# Patient Record
Sex: Female | Born: 1975 | Race: Black or African American | Hispanic: No | Marital: Married | Smoking: Never smoker
Health system: Southern US, Community
[De-identification: ages and names within clinical notes are randomized; demographics above are authoritative.]

## PROBLEM LIST (undated history)

## (undated) HISTORY — PX: CHOLECYSTECTOMY: SHX55

---

## 1999-02-18 ENCOUNTER — Inpatient Hospital Stay (HOSPITAL_COMMUNITY): Admission: AD | Admit: 1999-02-18 | Discharge: 1999-02-18 | Payer: Self-pay | Admitting: *Deleted

## 2001-11-10 ENCOUNTER — Emergency Department (HOSPITAL_COMMUNITY): Admission: EM | Admit: 2001-11-10 | Discharge: 2001-11-10 | Payer: Self-pay | Admitting: Emergency Medicine

## 2001-11-12 ENCOUNTER — Inpatient Hospital Stay (HOSPITAL_COMMUNITY): Admission: AD | Admit: 2001-11-12 | Discharge: 2001-11-12 | Payer: Self-pay | Admitting: *Deleted

## 2001-11-14 ENCOUNTER — Inpatient Hospital Stay (HOSPITAL_COMMUNITY): Admission: AD | Admit: 2001-11-14 | Discharge: 2001-11-14 | Payer: Self-pay | Admitting: *Deleted

## 2001-11-21 ENCOUNTER — Inpatient Hospital Stay (HOSPITAL_COMMUNITY): Admission: AD | Admit: 2001-11-21 | Discharge: 2001-11-21 | Payer: Self-pay | Admitting: Obstetrics and Gynecology

## 2002-09-22 ENCOUNTER — Inpatient Hospital Stay (HOSPITAL_COMMUNITY): Admission: AD | Admit: 2002-09-22 | Discharge: 2002-09-22 | Payer: Self-pay | Admitting: Obstetrics and Gynecology

## 2003-04-16 ENCOUNTER — Other Ambulatory Visit: Admission: RE | Admit: 2003-04-16 | Discharge: 2003-04-16 | Payer: Self-pay | Admitting: Obstetrics and Gynecology

## 2003-05-31 ENCOUNTER — Encounter: Payer: Self-pay | Admitting: Obstetrics and Gynecology

## 2003-05-31 ENCOUNTER — Ambulatory Visit (HOSPITAL_COMMUNITY): Admission: RE | Admit: 2003-05-31 | Discharge: 2003-05-31 | Payer: Self-pay | Admitting: Obstetrics and Gynecology

## 2003-08-27 ENCOUNTER — Ambulatory Visit (HOSPITAL_COMMUNITY): Admission: RE | Admit: 2003-08-27 | Discharge: 2003-08-27 | Payer: Self-pay | Admitting: Obstetrics and Gynecology

## 2003-10-27 ENCOUNTER — Inpatient Hospital Stay (HOSPITAL_COMMUNITY): Admission: AD | Admit: 2003-10-27 | Discharge: 2003-10-27 | Payer: Self-pay | Admitting: Obstetrics and Gynecology

## 2004-01-25 ENCOUNTER — Inpatient Hospital Stay (HOSPITAL_COMMUNITY): Admission: AD | Admit: 2004-01-25 | Discharge: 2004-01-27 | Payer: Self-pay | Admitting: Obstetrics and Gynecology

## 2004-06-25 ENCOUNTER — Other Ambulatory Visit: Admission: RE | Admit: 2004-06-25 | Discharge: 2004-06-25 | Payer: Self-pay | Admitting: Obstetrics and Gynecology

## 2005-04-20 ENCOUNTER — Inpatient Hospital Stay (HOSPITAL_COMMUNITY): Admission: AD | Admit: 2005-04-20 | Discharge: 2005-04-20 | Payer: Self-pay | Admitting: Obstetrics and Gynecology

## 2005-04-29 ENCOUNTER — Other Ambulatory Visit: Admission: RE | Admit: 2005-04-29 | Discharge: 2005-04-29 | Payer: Self-pay | Admitting: Obstetrics and Gynecology

## 2005-09-30 ENCOUNTER — Inpatient Hospital Stay (HOSPITAL_COMMUNITY): Admission: AD | Admit: 2005-09-30 | Discharge: 2005-09-30 | Payer: Self-pay | Admitting: Obstetrics and Gynecology

## 2006-04-24 ENCOUNTER — Ambulatory Visit (HOSPITAL_COMMUNITY): Admission: RE | Admit: 2006-04-24 | Discharge: 2006-04-25 | Payer: Self-pay

## 2006-09-09 ENCOUNTER — Inpatient Hospital Stay (HOSPITAL_COMMUNITY): Admission: AD | Admit: 2006-09-09 | Discharge: 2006-09-09 | Payer: Self-pay | Admitting: Obstetrics

## 2006-10-29 ENCOUNTER — Ambulatory Visit (HOSPITAL_COMMUNITY): Admission: RE | Admit: 2006-10-29 | Discharge: 2006-10-29 | Payer: Self-pay | Admitting: Obstetrics

## 2006-11-12 ENCOUNTER — Ambulatory Visit (HOSPITAL_COMMUNITY): Admission: RE | Admit: 2006-11-12 | Discharge: 2006-11-12 | Payer: Self-pay | Admitting: Obstetrics

## 2007-03-07 ENCOUNTER — Ambulatory Visit (HOSPITAL_COMMUNITY): Admission: RE | Admit: 2007-03-07 | Discharge: 2007-03-07 | Payer: Self-pay | Admitting: Obstetrics

## 2007-04-18 ENCOUNTER — Inpatient Hospital Stay (HOSPITAL_COMMUNITY): Admission: AD | Admit: 2007-04-18 | Discharge: 2007-04-21 | Payer: Self-pay | Admitting: Obstetrics & Gynecology

## 2008-02-19 ENCOUNTER — Emergency Department (HOSPITAL_COMMUNITY): Admission: EM | Admit: 2008-02-19 | Discharge: 2008-02-19 | Payer: Self-pay | Admitting: Emergency Medicine

## 2008-04-27 ENCOUNTER — Ambulatory Visit (HOSPITAL_COMMUNITY): Admission: RE | Admit: 2008-04-27 | Discharge: 2008-04-27 | Payer: Self-pay | Admitting: Obstetrics

## 2008-05-25 ENCOUNTER — Ambulatory Visit (HOSPITAL_COMMUNITY): Admission: RE | Admit: 2008-05-25 | Discharge: 2008-05-25 | Payer: Self-pay | Admitting: Obstetrics

## 2008-08-04 ENCOUNTER — Emergency Department (HOSPITAL_COMMUNITY): Admission: EM | Admit: 2008-08-04 | Discharge: 2008-08-05 | Payer: Self-pay | Admitting: Emergency Medicine

## 2008-10-06 ENCOUNTER — Inpatient Hospital Stay (HOSPITAL_COMMUNITY): Admission: AD | Admit: 2008-10-06 | Discharge: 2008-10-10 | Payer: Self-pay | Admitting: Obstetrics

## 2008-10-07 ENCOUNTER — Encounter: Payer: Self-pay | Admitting: Obstetrics & Gynecology

## 2010-10-26 ENCOUNTER — Encounter: Payer: Self-pay | Admitting: Obstetrics

## 2011-01-09 ENCOUNTER — Inpatient Hospital Stay (INDEPENDENT_AMBULATORY_CARE_PROVIDER_SITE_OTHER)
Admission: RE | Admit: 2011-01-09 | Discharge: 2011-01-09 | Disposition: A | Discharge status: Home / Self Care | Payer: Self-pay | Source: Ambulatory Visit | Attending: Emergency Medicine | Admitting: Emergency Medicine

## 2011-01-09 DIAGNOSIS — R112 Nausea with vomiting, unspecified: Secondary | ICD-10-CM

## 2011-01-09 LAB — POCT PREGNANCY, URINE: Preg Test, Ur: NEGATIVE

## 2011-01-09 LAB — POCT URINALYSIS DIP (DEVICE)
Bilirubin Urine: NEGATIVE
Glucose, UA: NEGATIVE mg/dL
Ketones, ur: NEGATIVE mg/dL
Nitrite: NEGATIVE
Protein, ur: NEGATIVE mg/dL
Specific Gravity, Urine: 1.015 (ref 1.005–1.030)
Urobilinogen, UA: 1 mg/dL (ref 0.0–1.0)
pH: 7 (ref 5.0–8.0)

## 2011-01-19 LAB — CBC
HCT: 26.9 % — ABNORMAL LOW (ref 36.0–46.0)
HCT: 30.9 % — ABNORMAL LOW (ref 36.0–46.0)
HCT: 34.7 % — ABNORMAL LOW (ref 36.0–46.0)
Hemoglobin: 10.4 g/dL — ABNORMAL LOW (ref 12.0–15.0)
Hemoglobin: 11.6 g/dL — ABNORMAL LOW (ref 12.0–15.0)
Hemoglobin: 9.1 g/dL — ABNORMAL LOW (ref 12.0–15.0)
MCHC: 33.4 g/dL (ref 30.0–36.0)
MCHC: 33.8 g/dL (ref 30.0–36.0)
MCHC: 33.9 g/dL (ref 30.0–36.0)
MCV: 83.7 fL (ref 78.0–100.0)
MCV: 84.6 fL (ref 78.0–100.0)
MCV: 84.8 fL (ref 78.0–100.0)
Platelets: 145 10*3/uL — ABNORMAL LOW (ref 150–400)
Platelets: 155 10*3/uL (ref 150–400)
Platelets: 171 10*3/uL (ref 150–400)
RBC: 3.17 MIL/uL — ABNORMAL LOW (ref 3.87–5.11)
RBC: 3.69 MIL/uL — ABNORMAL LOW (ref 3.87–5.11)
RBC: 4.1 MIL/uL (ref 3.87–5.11)
RDW: 14.5 % (ref 11.5–15.5)
RDW: 14.6 % (ref 11.5–15.5)
RDW: 15.1 % (ref 11.5–15.5)
WBC: 10.3 10*3/uL (ref 4.0–10.5)
WBC: 9.1 10*3/uL (ref 4.0–10.5)
WBC: 9.3 10*3/uL (ref 4.0–10.5)

## 2011-01-19 LAB — RPR: RPR Ser Ql: NONREACTIVE

## 2011-02-17 NOTE — Op Note (Signed)
NAMELINNET, BOTTARI              ACCOUNT NO.:  [id number]   MEDICAL RECORD NO.:  [id number]          PATIENT TYPE:  INP   LOCATION:  9109                          FACILITY:  [facility]   PHYSICIAN:  Roseanna Rainbow, M.D.DATE OF BIRTH:  05/13/1976   DATE OF PROCEDURE:  10/07/2008  DATE OF DISCHARGE:                               OPERATIVE REPORT   PREOPERATIVE DIAGNOSES:  1. Intrauterine pregnancy at term.  2. Breech presentation.  3. Oligohydramnios.   POSTOPERATIVE DIAGNOSES:  1. Intrauterine pregnancy at term.  2. Breech presentation.  3. Oligohydramnios.   PROCEDURE:  Primary low-uterine flap elliptical cesarean delivery.   SURGEONS:  Roseanna Rainbow, MD and Kathreen Cosier, MD   ANESTHESIA:  Spinal.   PATHOLOGY:  Placenta.   ESTIMATED BLOOD LOSS:  500 mL.   COMPLICATIONS:  None.   DESCRIPTION OF PROCEDURE:  The patient was taken to the operating room  with an IV running.  She was placed in the dorsal supine position with a  leftward tilt and prepped and draped in the usual sterile fashion.  After a time-out had been completed, a transverse skin incision was then  made with the scalpel and carried down to the fascia.  The fascia was  nicked in the midline.  The fascial incision was then extended  bilaterally.  The superior aspect of the fascial incision was tented up  and the underlying rectus muscles dissected off.  The inferior aspect of  the fascial incision was manipulated in a similar fashion.  The rectus  muscles were separated in the midline.  The parietal peritoneum was  tented up and entered sharply.  This incision was then extended  superiorly and inferiorly with good visualization of the bladder.  The  bladder blade was then placed.  The vesicouterine peritoneum was tented  up and entered sharply.  This incision was then extended bilaterally and  the bladder flap created bluntly.  At this point, a loop of sigmoid  colon was packed away with a  moistened laparotomy sponge.  The lower  uterine segment was then incised in a transverse fashion with the  scalpel.  This incision was then extended bluntly.  A complete breech  extraction was then performed.  Marcells Smelly Vite maneuver was  performed to facilitate delivery of the head, which was delivered  atraumatically.  The cord was clamped and cut.  The infant was handed  off to the awaiting neonatologist.  The placenta was then removed.  The  intrauterine cavity was evacuated of any remaining amniotic fluid,  clots, and debris with moist laparotomy sponge.  The uterine incision  was then reapproximated in a running interlocking fashion using a suture  of 0 Monocryl.  A second imbricating layer of the same suture was  placed.  Adequate hemostasis was noted.  The paracolic gutters were  copiously irrigated.  The parietal peritoneum was reapproximated in a  running fashion using a 2-0 Vicryl suture.  The fascia was closed using  2 running sutures of 0 Vicryl meeting in the midline.  The skin was  closed in a subcuticular fashion  using 3-0 Vicryl.  Dermabond was then  applied.  At the closure of the procedure, the instrument and pack  counts were said to be correct x2.  A 2 g of cephazolin had been given  at the start of the procedure.  The patient was taken to the PACU,  awake, and in stable condition.      Roseanna Rainbow, M.D.  Electronically Signed     LAJ/MEDQ  D:  10/07/2008  T:  10/07/2008  Job:  [id number]

## 2011-02-17 NOTE — H&P (Signed)
NAMEARLYS, Helen Murphy              ACCOUNT NO.:  [id number]   MEDICAL RECORD NO.:  [id number]          PATIENT TYPE:  INP   LOCATION:  9173                          FACILITY:  [facility]   PHYSICIAN:  Roseanna Rainbow, M.D.DATE OF BIRTH:  [birth year]   DATE OF ADMISSION:  04/18/2007  DATE OF DISCHARGE:                              HISTORY & PHYSICAL   CHIEF COMPLAINT:  The patient is a 35 year old para I with an estimated  date of confinement of July 14 with an intrauterine pregnancy at 40  weeks, complaining of contractions.   HISTORY OF PRESENT ILLNESS:  Please see the above.   ALLERGIES:  No known drug allergies.   MEDICATIONS:  Prenatal vitamins.   OBSTETRICAL RISK FACTORS:  She is Rh-negative, non-sensitized, history  of recurrent pregnancy wastage.   PRENATAL SCREENING:  Chlamydia probe negative.  GC probe negative.  Urine culture and sensitivity:  No growth.  Hepatitis B surface antigen  negative.  HIV nonreactive.  Hematocrit 37.4, hemoglobin 12.5, platelets  259,000.  Blood type is O negative, antibody screen negative.  RPR  nonreactive.  Rubella immune.  Sickle cell negative.  Twenty-four-hour  urine in December 2007:  Protein 63 mg per day of protein.  GBS  negative.   PAST GYNECOLOGICAL HISTORY:  Noncontributory.   PAST MEDICAL HISTORY:  No significant history of medical diseases.   PAST SURGICAL HISTORY:  No previous surgery.   SOCIAL HISTORY:  She is employed.  She performs office work.  She is  married, living with her spouse, does not give any significant history  of alcohol usage, has no significant smoking history, denies illicit  drug use.   FAMILY HISTORY:  No major illnesses known.   OBSTETRICAL HISTORY:  She has had 7 previous spontaneous abortions.  In  April 2005, she was delivered of a 7-pound 6-ounce female, full-term  vaginal delivery, no complications.   PHYSICAL EXAMINATION:  VITAL SIGNS:  Pulse 69, respirations 18, blood  pressure 113/86.   Fetal heart tracing:  Initial baseline 170s with a  decrease in the baseline to 150s to 160s, no decelerations.  PELVIC:  On sterile vaginal exam per the RN, the cervix is fingertip,  30% effaced with a vertex at a -3 station.  A BPP was 8/8; however, the  amniotic fluid index was 6.9.   ASSESSMENT:  1. Intrauterine pregnancy at term.  2. Oligohydramnios.  3. Unfavorable Bishop score.  4. Fetal heart tracing consistent with fetal well-being as well as the      biophysical profile.   PLAN:  Admission, 2-stage induction of labor.      Roseanna Rainbow, M.D.  Electronically Signed     LAJ/MEDQ  D:  04/19/2007  T:  04/19/2007  Job:  [id number]

## 2011-02-20 NOTE — H&P (Signed)
NAME:  Helen Murphy, Helen Murphy                ACCOUNT NO.:  [id number]   MEDICAL RECORD NO.:  [id number]                   PATIENT TYPE:  INP   LOCATION:  9170                                 FACILITY:  [facility]   PHYSICIAN:  Osborn Coho, M.D.                DATE OF BIRTH:  August 28, 1976   DATE OF ADMISSION:  01/25/2004  DATE OF DISCHARGE:                                HISTORY & PHYSICAL   This is a 35 year old gravida 4, para 0-0-3-0, at 40-1/7 weeks, who presents  with complaints of increased uterine contractions over the past few hours.  She denies leaking or bleeding and reports positive fetal movement.  Pregnancy has been followed by Dr. Pennie Rushing and remarkable for:   1. Rh negative.  2. SAB x3.  3. Language barrier.  4. Family history of diabetes.  5. Group B strep negative.    PAST OBSTETRICAL HISTORY:  Remarkable for first trimester spontaneous  abortions in 2000, 2002, and 2003.   PAST MEDICAL HISTORY:  Unremarkable.   FAMILY HISTORY:  Remarkable for mother and sister with hypertension, mother  and a brother with noninsulin-dependent diabetes, and a sister with thyroid  dysfunction of unknown type.   GENETIC HISTORY:  Unremarkable.   SOCIAL HISTORY:  The patient is married to Ahmed, who is involved and  supportive.  She is of the Muslim faith.  She denies any alcohol, tobacco,  or drug use.   PRENATAL LABORATORY DATA:  Hemoglobin 11.6, platelets 241.  Blood type O  negative, antibody screen negative.  RPR negative.  Rubella immune.  Hepatitis negative.  HIV declined.  Pap test normal.  Gonorrhea negative,  Chlamydia negative.  Cystic fibrosis declined.  Hemoglobin electrophoresis  within normal limits.   HISTORY OF CURRENT PREGNANCY:  The patient entered care at 10 weeks.  She  was on progesterone suppositories in the first part of her pregnancy to  prevent miscarriage.  She did well.  She developed some anemia and was  placed on iron supplementation.  She had an  elevated Glucola and underwent a  three-hour GTT, which was normal.  Her group B strep was negative.  The rest  of the pregnancy was normal.   OBJECTIVE:  VITAL SIGNS:  Stable, afebrile.  HEENT:  Within normal limits.  NECK:  Thyroid normal, not enlarged.  CHEST:  Clear to auscultation.  CARDIAC:  Regular rate and rhythm.  ABDOMEN:  Gravid at 40 cm, vertex to Leopold's.  EFM shows reactive fetal  heart rate with uterine contractions every two minutes.  PELVIC:  Cervix is 5 cm, 85%, -1, vertex presentation.  EXTREMITIES:  Within normal limits.   The patient is requesting an epidural.   ASSESSMENT:  1. Intrauterine pregnancy at term.  2. Active labor.   PLAN:  1. Admit to birthing suites per Dr. Su Hilt.  2. Routine M.D. orders.  3. Epidural.     Marie L. Williams, C.N.M.  Osborn Coho, M.D.    MLW/MEDQ  D:  01/25/2004  T:  01/25/2004  Job:  [id number]

## 2011-02-20 NOTE — Discharge Summary (Signed)
Helen Murphy, Helen Murphy              ACCOUNT NO.:  [id number]   MEDICAL RECORD NO.:  [id number]           PATIENT TYPE:   LOCATION:                                 FACILITY:   PHYSICIAN:  Charles A. Clearance Coots, M.D.DATE OF BIRTH:  [birth year]   DATE OF ADMISSION:  10/07/2008  DATE OF DISCHARGE:  10/10/2008                               DISCHARGE SUMMARY   ADMITTING DIAGNOSES:  Intrauterine pregnancy at term, uterine  contractions, breech presentation, oligohydramnios.   DISCHARGE DIAGNOSES:  Intrauterine pregnancy at term, uterine  contractions, breech presentation, oligohydramnios status post primary  low transverse cesarean section on October 07, 2008.   Viable female fetus was delivered at 8:15.  Apgars of 9 at one minute and  9 at five minutes, weight of 3840 g, length of 53.34 cm.  Mother and  infant discharged home in good condition.   REASON FOR ADMISSION:  A 35 year old [place] female presented  with uterine contractions.  On exam, presentation was noted to be  breech.  Ultrasound was done which confirmed the breech presentation  with amniotic fluid index of 2.  The decision was made to proceed with  cesarean section delivery for breech presentation at term.   PAST MEDICAL HISTORY:  Surgery:  None.  Illnesses:  None.   MEDICATIONS:  Prenatal vitamins.   ALLERGIES:  No known drug allergies.   SOCIAL HISTORY:  Married.  Negative tobacco, alcohol, or recreational  drug use.   FAMILY HISTORY:  No major illnesses known.   PHYSICAL EXAMINATION:  GENERAL:  Well-nourished, well-developed female  in no acute distress.  LUNGS:  Clear to auscultation bilaterally.  HEART:  Regular rate and rhythm.  ABDOMEN:  Gravid, nontender.  Cervix 2 cm dilated, 50% effaced, and the  presentation was breech.   ADMITTING LABORATORY DATA:  Hemoglobin 11.6, hematocrit 34.7, white  blood cell count 9100, platelets 145,000.  RPR was nonreactive.   HOSPITAL COURSE:  The patient underwent  primary low transverse cesarean  section on October 07, 2008.  There are no intraoperative complications.  Postoperative course was uncomplicated.  The patient was discharged home  on postop day #3 in good condition.   DISCHARGE LABORATORY DATA:  Hemoglobin 9.1, hematocrit 26.9, white blood  cell count 10,300, platelets 155,000.   DISCHARGE DISPOSITION:  Medications, continue prenatal vitamins.  Ibuprofen and Percocet were prescribed for pain.  Routine written  instructions were given for discharge after cesarean section.  The  patient should call office for followup appointment in 2 weeks.      Charles A. Clearance Coots, M.D.  Electronically Signed     CAH/MEDQ  D:  11/13/2008  T:  11/13/2008  Job:  [id number]

## 2011-07-07 LAB — POCT CARDIAC MARKERS
CKMB, poc: <1 — ABNORMAL LOW
Myoglobin, poc: 22.4

## 2011-07-07 LAB — POCT I-STAT, CHEM 8
BUN: <3 — ABNORMAL LOW
Calcium, Ion: 1.16
Chloride: 108
Creatinine, Ser: 0.7
Glucose, Bld: 100 — ABNORMAL HIGH
HCT: 34 — ABNORMAL LOW
Hemoglobin: 11.6 — ABNORMAL LOW
Potassium: 3.4 — ABNORMAL LOW
Sodium: 138
TCO2: 21

## 2011-07-07 LAB — GLUCOSE, CAPILLARY: Glucose-Capillary: 105 — ABNORMAL HIGH

## 2011-07-07 LAB — CBC
HCT: 34.6 — ABNORMAL LOW
Hemoglobin: 11.7 — ABNORMAL LOW
MCHC: 33.9
MCV: 85.1
Platelets: 151
RBC: 4.07
RDW: 13
WBC: 6.1

## 2011-07-20 LAB — CBC
MCHC: 33.3
MCV: 84.8
Platelets: 188
RBC: 3.77 — ABNORMAL LOW
RDW: 14.8 — ABNORMAL HIGH

## 2011-07-20 LAB — RH IMMUNE GLOB WKUP(>/=20WKS)(NOT WOMEN'S HOSP): Fetal Screen: NEGATIVE

## 2011-07-21 LAB — CBC
HCT: 42.4
Hemoglobin: 14
MCHC: 33.1
MCV: 84.5
RBC: 5.01
RDW: 14

## 2012-07-07 ENCOUNTER — Emergency Department (HOSPITAL_COMMUNITY): Payer: Medicaid Other

## 2012-07-07 ENCOUNTER — Emergency Department (HOSPITAL_COMMUNITY)
Admission: EM | Admit: 2012-07-07 | Discharge: 2012-07-08 | Disposition: A | Discharge status: Home / Self Care | Payer: Medicaid Other | Attending: Emergency Medicine | Admitting: Emergency Medicine

## 2012-07-07 ENCOUNTER — Encounter (HOSPITAL_COMMUNITY): Payer: Self-pay | Admitting: Emergency Medicine

## 2012-07-07 DIAGNOSIS — N200 Calculus of kidney: Secondary | ICD-10-CM | POA: Insufficient documentation

## 2012-07-07 DIAGNOSIS — D72829 Elevated white blood cell count, unspecified: Secondary | ICD-10-CM | POA: Insufficient documentation

## 2012-07-07 DIAGNOSIS — R509 Fever, unspecified: Secondary | ICD-10-CM | POA: Insufficient documentation

## 2012-07-07 DIAGNOSIS — R059 Cough, unspecified: Secondary | ICD-10-CM | POA: Insufficient documentation

## 2012-07-07 DIAGNOSIS — J3489 Other specified disorders of nose and nasal sinuses: Secondary | ICD-10-CM | POA: Insufficient documentation

## 2012-07-07 DIAGNOSIS — K8020 Calculus of gallbladder without cholecystitis without obstruction: Secondary | ICD-10-CM

## 2012-07-07 DIAGNOSIS — R7401 Elevation of levels of liver transaminase levels: Secondary | ICD-10-CM | POA: Insufficient documentation

## 2012-07-07 DIAGNOSIS — M549 Dorsalgia, unspecified: Secondary | ICD-10-CM | POA: Insufficient documentation

## 2012-07-07 DIAGNOSIS — R05 Cough: Secondary | ICD-10-CM | POA: Insufficient documentation

## 2012-07-07 DIAGNOSIS — R109 Unspecified abdominal pain: Secondary | ICD-10-CM | POA: Insufficient documentation

## 2012-07-07 DIAGNOSIS — R748 Abnormal levels of other serum enzymes: Secondary | ICD-10-CM

## 2012-07-07 DIAGNOSIS — N898 Other specified noninflammatory disorders of vagina: Secondary | ICD-10-CM | POA: Insufficient documentation

## 2012-07-07 DIAGNOSIS — R070 Pain in throat: Secondary | ICD-10-CM | POA: Insufficient documentation

## 2012-07-07 DIAGNOSIS — R0982 Postnasal drip: Secondary | ICD-10-CM | POA: Insufficient documentation

## 2012-07-07 DIAGNOSIS — R079 Chest pain, unspecified: Secondary | ICD-10-CM | POA: Insufficient documentation

## 2012-07-07 DIAGNOSIS — R7402 Elevation of levels of lactic acid dehydrogenase (LDH): Secondary | ICD-10-CM | POA: Insufficient documentation

## 2012-07-07 LAB — COMPREHENSIVE METABOLIC PANEL
ALT: 38 U/L — ABNORMAL HIGH (ref 0–35)
Alkaline Phosphatase: 126 U/L — ABNORMAL HIGH (ref 39–117)
BUN: 6 mg/dL (ref 6–23)
CO2: 25 mEq/L (ref 19–32)
GFR calc Af Amer: >90 mL/min (ref >90)
GFR calc non Af Amer: >90 mL/min (ref >90)
Glucose, Bld: 109 mg/dL — ABNORMAL HIGH (ref 70–99)
Potassium: 3.6 mEq/L (ref 3.5–5.1)
Sodium: 133 mEq/L — ABNORMAL LOW (ref 135–145)

## 2012-07-07 LAB — CBC WITH DIFFERENTIAL/PLATELET
Eosinophils Relative: 1 % (ref 0–5)
Hemoglobin: 12.3 g/dL (ref 12.0–15.0)
Lymphocytes Relative: 21 % (ref 12–46)
Lymphs Abs: 3.4 10*3/uL (ref 0.7–4.0)
MCV: 82.6 fL (ref 78.0–100.0)
Monocytes Relative: 8 % (ref 3–12)
Neutrophils Relative %: 70 % (ref 43–77)
Platelets: 203 10*3/uL (ref 150–400)
RBC: 4.32 MIL/uL (ref 3.87–5.11)
WBC: 16.3 10*3/uL — ABNORMAL HIGH (ref 4.0–10.5)

## 2012-07-07 LAB — WET PREP, GENITAL

## 2012-07-07 LAB — URINALYSIS, ROUTINE W REFLEX MICROSCOPIC
Glucose, UA: NEGATIVE mg/dL
Ketones, ur: NEGATIVE mg/dL
Nitrite: NEGATIVE
Specific Gravity, Urine: 1.024 (ref 1.005–1.030)
pH: 6.5 (ref 5.0–8.0)

## 2012-07-07 LAB — RAPID STREP SCREEN (MED CTR MEBANE ONLY): Streptococcus, Group A Screen (Direct): NEGATIVE

## 2012-07-07 MED ORDER — MORPHINE SULFATE 2 MG/ML IJ SOLN
2.0000 mg | Freq: Once | INTRAMUSCULAR | Status: AC
  Administered 2012-07-07: 2 mg via INTRAVENOUS
  Filled 2012-07-07: qty 1

## 2012-07-07 MED ORDER — SODIUM CHLORIDE 0.9 % IV BOLUS (SEPSIS)
500.0000 mL | Freq: Once | INTRAVENOUS | Status: AC
  Administered 2012-07-07: 500 mL via INTRAVENOUS

## 2012-07-07 MED ORDER — IOHEXOL 300 MG/ML  SOLN
100.0000 mL | Freq: Once | INTRAMUSCULAR | Status: AC | PRN
  Administered 2012-07-07: 100 mL via INTRAVENOUS

## 2012-07-07 NOTE — ED Notes (Signed)
Pt to ultrasound with Porfirio Mylar NT as chaperone.

## 2012-07-07 NOTE — ED Provider Notes (Signed)
History     CSN: [id number]  Arrival date & time 07/07/12  1723   First MD Initiated Contact with Patient 07/07/12 1814      Chief Complaint  Patient presents with  . Flank Pain  . Sore Throat  . Fever  . Chest Pain    Intermittent    (Consider location/radiation/quality/duration/timing/severity/associated sxs/prior treatment) The history is provided by the patient, the spouse and medical records.    Helen Murphy is a 36 y.o. female presents to the emergency department complaining of right flank pain, fever, sore throat and chest pain.  The onset of the symptoms was  gradual starting 3 days ago.  The patient has associated congestion, cough.  The symptoms have been  intermittent, gradually worsened.  nothing makes the symptoms worse and nothing makes symptoms better.  The patient denies neck pain, chest pain, abdominal pain, nausea, vomiting, diarrhea, dysuria, hematuria, frequency, urgency.  Pt states she has 3 children who are at home with URIs.  She states her CP is sharp, intermittent, initiated by and worsened with coughing.  She states her R flank pain is also intermittent, sharp, rated at a 10/10 and radiates into her back.  It began yesterday and lasted approx 2 hours after which it resolved.  Pt states it returned this morning and then resolved prior to arrival.    History reviewed. No pertinent past medical history.  Past Surgical History  Procedure Date  . Cesarean section     No family history on file.  History  Substance Use Topics  . Smoking status: Never Smoker   . Smokeless tobacco: Not on file  . Alcohol Use: No    OB History    Grav Para Term Preterm Abortions TAB SAB Ect Mult Living                  Review of Systems  Constitutional: Positive for fever, chills and activity change. Negative for diaphoresis, appetite change, fatigue and unexpected weight change.  HENT: Positive for congestion, sore throat, rhinorrhea, postnasal drip and  sinus pressure. Negative for nosebleeds, mouth sores, neck pain and neck stiffness.   Eyes: Negative for visual disturbance.  Respiratory: Positive for cough and chest tightness. Negative for shortness of breath and wheezing.   Cardiovascular: Positive for chest pain.  Gastrointestinal: Negative for nausea, vomiting, abdominal pain, diarrhea and constipation.  Genitourinary: Positive for flank pain. Negative for dysuria, urgency, frequency and hematuria.  Musculoskeletal: Positive for back pain. Negative for myalgias, joint swelling and gait problem.  Skin: Negative for rash.  Neurological: Negative for syncope, light-headedness and headaches.  Hematological: Does not bruise/bleed easily.  Psychiatric/Behavioral: Negative for disturbed wake/sleep cycle. The patient is not nervous/anxious.   All other systems reviewed and are negative.    Allergies  Review of patient's allergies indicates no known allergies.  Home Medications  No current outpatient prescriptions on file.  BP 131/81  Pulse 85  Temp 98.6 F (37 C) (Oral)  Resp 17  SpO2 100%  LMP 06/21/2012  Physical Exam  Nursing note and vitals reviewed. Constitutional: She appears well-developed and well-nourished. No distress.  HENT:  Head: Normocephalic and atraumatic.  Right Ear: Tympanic membrane, external ear and ear canal normal.  Left Ear: Tympanic membrane, external ear and ear canal normal.  Nose: Mucosal edema and rhinorrhea present. Right sinus exhibits no maxillary sinus tenderness and no frontal sinus tenderness. Left sinus exhibits no maxillary sinus tenderness and no frontal sinus tenderness.  Mouth/Throat: Uvula is  midline, oropharynx is clear and moist and mucous membranes are normal. Mucous membranes are not pale and not cyanotic. No uvula swelling. No oropharyngeal exudate, posterior oropharyngeal edema, posterior oropharyngeal erythema or tonsillar abscesses.  Eyes: Conjunctivae normal are normal. Pupils are  equal, round, and reactive to light. No scleral icterus.  Neck: Normal range of motion. Neck supple.  Cardiovascular: Normal rate, regular rhythm and intact distal pulses.  Exam reveals no gallop and no friction rub.   No murmur heard. Pulmonary/Chest: Effort normal and breath sounds normal. No respiratory distress. She has no wheezes. She has no rales. She exhibits no tenderness.  Abdominal: Soft. Normal appearance and bowel sounds are normal. She exhibits no mass. There is no hepatosplenomegaly. There is tenderness in the right lower quadrant. There is CVA tenderness. There is no rigidity, no rebound, no guarding, no tenderness at McBurney's point and negative Murphy's sign. Hernia confirmed negative in the right inguinal area and confirmed negative in the left inguinal area.  Genitourinary: Pelvic exam was performed with patient supine. No labial fusion. There is no rash, tenderness, lesion or injury on the right labia. There is no rash, tenderness, lesion or injury on the left labia. Uterus is not deviated, not enlarged, not fixed and not tender. Cervix exhibits discharge. Cervix exhibits no motion tenderness. Right adnexum displays no mass, no tenderness and no fullness. Left adnexum displays no mass and no tenderness. No erythema, tenderness or bleeding around the vagina. No foreign body around the vagina. No signs of injury around the vagina. Vaginal discharge found.       Strawberry cervix with gray frothy discharge  Musculoskeletal: Normal range of motion. She exhibits no edema.  Lymphadenopathy:    She has no cervical adenopathy.       Right: No inguinal adenopathy present.       Left: No inguinal adenopathy present.  Neurological: She is alert. She exhibits normal muscle tone. Coordination normal.       Speech is clear and goal oriented Moves extremities without ataxia  Skin: Skin is warm and dry. No rash noted. She is not diaphoretic.  Psychiatric: She has a normal mood and affect.     ED Course  Procedures (including critical care time)  Labs Reviewed  URINALYSIS, ROUTINE W REFLEX MICROSCOPIC - Abnormal; Notable for the following:    Hgb urine dipstick SMALL (*)     Bilirubin Urine SMALL (*)     Leukocytes, UA SMALL (*)     All other components within normal limits  CBC WITH DIFFERENTIAL - Abnormal; Notable for the following:    WBC 16.3 (*)     HCT 35.7 (*)     Neutro Abs 11.4 (*)     Monocytes Absolute 1.3 (*)     All other components within normal limits  COMPREHENSIVE METABOLIC PANEL - Abnormal; Notable for the following:    Sodium 133 (*)     Glucose, Bld 109 (*)     Total Protein 8.4 (*)     AST 52 (*)     ALT 38 (*)     Alkaline Phosphatase 126 (*)     All other components within normal limits  URINE MICROSCOPIC-ADD ON - Abnormal; Notable for the following:    Squamous Epithelial / LPF FEW (*)     All other components within normal limits  RAPID STREP SCREEN  POCT PREGNANCY, URINE  LIPASE, BLOOD  WET PREP, GENITAL  GC/CHLAMYDIA PROBE AMP, GENITAL  GC/CHLAMYDIA PROBE AMP, GENITAL  Dg Chest 2 View  07/07/2012  *RADIOLOGY REPORT*  Clinical Data: 36 year old female left side chest pain cough and congestion.  CHEST - 2 VIEW  Comparison: Chest CTA 08/04/2008.  Findings: Slightly shallow lung volumes.  Cardiac size and mediastinal contours are within normal limits.  Visualized tracheal air column is within normal limits.  The lungs are clear.  No pneumothorax or effusion.  EKG leads and wires overlie the chest. No acute osseous abnormality identified.  IMPRESSION: Negative, no acute cardiopulmonary abnormality.   Original Report Authenticated By: Harley Hallmark, M.D.    Results for orders placed during the hospital encounter of 07/07/12  RAPID STREP SCREEN      Component Value Range   Streptococcus, Group A Screen (Direct) NEGATIVE  NEGATIVE  URINALYSIS, ROUTINE W REFLEX MICROSCOPIC      Component Value Range   Color, Urine YELLOW  YELLOW    APPearance CLEAR  CLEAR   Specific Gravity, Urine 1.024  1.005 - 1.030   pH 6.5  5.0 - 8.0   Glucose, UA NEGATIVE  NEGATIVE mg/dL   Hgb urine dipstick SMALL (*) NEGATIVE   Bilirubin Urine SMALL (*) NEGATIVE   Ketones, ur NEGATIVE  NEGATIVE mg/dL   Protein, ur NEGATIVE  NEGATIVE mg/dL   Urobilinogen, UA 1.0  0.0 - 1.0 mg/dL   Nitrite NEGATIVE  NEGATIVE   Leukocytes, UA SMALL (*) NEGATIVE  POCT PREGNANCY, URINE      Component Value Range   Preg Test, Ur NEGATIVE  NEGATIVE  CBC WITH DIFFERENTIAL      Component Value Range   WBC 16.3 (*) 4.0 - 10.5 K/uL   RBC 4.32  3.87 - 5.11 MIL/uL   Hemoglobin 12.3  12.0 - 15.0 g/dL   HCT 84.1 (*) 32.4 - 40.1 %   MCV 82.6  78.0 - 100.0 fL   MCH 28.5  26.0 - 34.0 pg   MCHC 34.5  30.0 - 36.0 g/dL   RDW 02.7  25.3 - 66.4 %   Platelets 203  150 - 400 K/uL   Neutrophils Relative 70  43 - 77 %   Neutro Abs 11.4 (*) 1.7 - 7.7 K/uL   Lymphocytes Relative 21  12 - 46 %   Lymphs Abs 3.4  0.7 - 4.0 K/uL   Monocytes Relative 8  3 - 12 %   Monocytes Absolute 1.3 (*) 0.1 - 1.0 K/uL   Eosinophils Relative 1  0 - 5 %   Eosinophils Absolute 0.1  0.0 - 0.7 K/uL   Basophils Relative 0  0 - 1 %   Basophils Absolute 0.0  0.0 - 0.1 K/uL  COMPREHENSIVE METABOLIC PANEL      Component Value Range   Sodium 133 (*) 135 - 145 mEq/L   Potassium 3.6  3.5 - 5.1 mEq/L   Chloride 100  96 - 112 mEq/L   CO2 25  19 - 32 mEq/L   Glucose, Bld 109 (*) 70 - 99 mg/dL   BUN 6  6 - 23 mg/dL   Creatinine, Ser 4.03  0.50 - 1.10 mg/dL   Calcium 8.9  8.4 - 47.4 mg/dL   Total Protein 8.4 (*) 6.0 - 8.3 g/dL   Albumin 3.5  3.5 - 5.2 g/dL   AST 52 (*) 0 - 37 U/L   ALT 38 (*) 0 - 35 U/L   Alkaline Phosphatase 126 (*) 39 - 117 U/L   Total Bilirubin 0.3  0.3 - 1.2 mg/dL   GFR calc non  Af Amer >90  >90 mL/min   GFR calc Af Amer >90  >90 mL/min  LIPASE, BLOOD      Component Value Range   Lipase 35  11 - 59 U/L  URINE MICROSCOPIC-ADD ON      Component Value Range   Squamous  Epithelial / LPF FEW (*) RARE   WBC, UA 3-6  <3 WBC/hpf   RBC / HPF 3-6  <3 RBC/hpf   Dg Chest 2 View  07/07/2012  *RADIOLOGY REPORT*  Clinical Data: 36 year old female left side chest pain cough and congestion.  CHEST - 2 VIEW  Comparison: Chest CTA 08/04/2008.  Findings: Slightly shallow lung volumes.  Cardiac size and mediastinal contours are within normal limits.  Visualized tracheal air column is within normal limits.  The lungs are clear.  No pneumothorax or effusion.  EKG leads and wires overlie the chest. No acute osseous abnormality identified.  IMPRESSION: Negative, no acute cardiopulmonary abnormality.   Original Report Authenticated By: Harley Hallmark, M.D.    1. Abdominal pain       MDM  Helen Murphy resents with cough congestion fever chest pain flank pain.  Cough congestion fever chest pain likely associated with a URI.  Flank pain concerning for kidney stone; however patient has pain to palpation of the right lower quadrant pain needing to rule out appendicitis.  Patient with leukocytosis of 16.3 and left shift, urinalysis with small leukocytes small amount hemoglobin and a few white blood cells, lipase within normal limits, CMP with elevated liver enzymes and alkaline phosphatase.  Strep screen negative, chest x-ray without evidence of infiltrate.  Patient remains alert and oriented, vital signs stable, nonseptic nontoxic appearing, in no acute distress.  Pelvic exam concerning for possible trichomoniasis; awaiting the wet prep.  Patient is finishing her oral contrast and we have ordered an abdominal/pelvic CT.  Dr Ranae Palms will assume care.  Dr. Loren Racer was consulted, evaluated this patient with me and agrees with the plan.           Dahlia Client Brooke Steinhilber, PA-C 07/07/12 2044

## 2012-07-07 NOTE — ED Provider Notes (Signed)
Pt report received from Dr. Ranae Palms.  Pt presents with URI sxs.  She however had elevated liver enzyme, leukocytosis and has reproducible Murphy sign with palpation.  Plan to consult surgery once [place] result is back.    12:41 AM Abd US shows cholelithiasis without evidence of cholecystitis.  Pt's pain is controlled.  She has no fever.  I have consulted with surgeon Dr. Abbey Chatters who recommend outpt f/u for further care.  I encourage pt to stay on a clear liquid diet and to call [facility] for f/u.  Strict return precaution including fever, vomit, or worsening pain to return to ER.  Pt voice understanding and agrees with plan.    BP 125/81  Pulse 94  Temp 98.6 F (37 C) (Oral)  Resp 16  SpO2 100%  LMP 06/21/2012   Results for orders placed during the hospital encounter of 07/07/12  RAPID STREP SCREEN      Component Value Range   Streptococcus, Group A Screen (Direct) NEGATIVE  NEGATIVE  URINALYSIS, ROUTINE W REFLEX MICROSCOPIC      Component Value Range   Color, Urine YELLOW  YELLOW   APPearance CLEAR  CLEAR   Specific Gravity, Urine 1.024  1.005 - 1.030   pH 6.5  5.0 - 8.0   Glucose, UA NEGATIVE  NEGATIVE mg/dL   Hgb urine dipstick SMALL (*) NEGATIVE   Bilirubin Urine SMALL (*) NEGATIVE   Ketones, ur NEGATIVE  NEGATIVE mg/dL   Protein, ur NEGATIVE  NEGATIVE mg/dL   Urobilinogen, UA 1.0  0.0 - 1.0 mg/dL   Nitrite NEGATIVE  NEGATIVE   Leukocytes, UA SMALL (*) NEGATIVE  POCT PREGNANCY, URINE      Component Value Range   Preg Test, Ur NEGATIVE  NEGATIVE  CBC WITH DIFFERENTIAL      Component Value Range   WBC 16.3 (*) 4.0 - 10.5 K/uL   RBC 4.32  3.87 - 5.11 MIL/uL   Hemoglobin 12.3  12.0 - 15.0 g/dL   HCT 16.1 (*) 09.6 - 04.5 %   MCV 82.6  78.0 - 100.0 fL   MCH 28.5  26.0 - 34.0 pg   MCHC 34.5  30.0 - 36.0 g/dL   RDW 40.9  81.1 - 91.4 %   Platelets 203  150 - 400 K/uL   Neutrophils Relative 70  43 - 77 %   Neutro Abs 11.4 (*) 1.7 - 7.7 K/uL   Lymphocytes Relative 21  12 - 46 %   Lymphs Abs 3.4  0.7 - 4.0 K/uL   Monocytes Relative 8  3 - 12 %   Monocytes Absolute 1.3 (*) 0.1 - 1.0 K/uL   Eosinophils Relative 1  0 - 5 %   Eosinophils Absolute 0.1  0.0 - 0.7 K/uL   Basophils Relative 0  0 - 1 %   Basophils Absolute 0.0  0.0 - 0.1 K/uL  COMPREHENSIVE METABOLIC PANEL      Component Value Range   Sodium 133 (*) 135 - 145 mEq/L   Potassium 3.6  3.5 - 5.1 mEq/L   Chloride 100  96 - 112 mEq/L   CO2 25  19 - 32 mEq/L   Glucose, Bld 109 (*) 70 - 99 mg/dL   BUN 6  6 - 23 mg/dL   Creatinine, Ser 7.82  0.50 - 1.10 mg/dL   Calcium 8.9  8.4 - 95.6 mg/dL   Total Protein 8.4 (*) 6.0 - 8.3 g/dL   Albumin 3.5  3.5 - 5.2 g/dL   AST  52 (*) 0 - 37 U/L   ALT 38 (*) 0 - 35 U/L   Alkaline Phosphatase 126 (*) 39 - 117 U/L   Total Bilirubin 0.3  0.3 - 1.2 mg/dL   GFR calc non Af Amer >90  >90 mL/min   GFR calc Af Amer >90  >90 mL/min  LIPASE, BLOOD      Component Value Range   Lipase 35  11 - 59 U/L  URINE MICROSCOPIC-ADD ON      Component Value Range   Squamous Epithelial / LPF FEW (*) RARE   WBC, UA 3-6  <3 WBC/hpf   RBC / HPF 3-6  <3 RBC/hpf  WET PREP, GENITAL      Component Value Range   Yeast Wet Prep HPF POC NONE SEEN  NONE SEEN   Trich, Wet Prep NONE SEEN  NONE SEEN   Clue Cells Wet Prep HPF POC FEW (*) NONE SEEN   WBC, Wet Prep HPF POC MANY (*) NONE SEEN   Dg Chest 2 View  07/07/2012  *RADIOLOGY REPORT*  Clinical Data: 36 year old female left side chest pain cough and congestion.  CHEST - 2 VIEW  Comparison: Chest CTA 08/04/2008.  Findings: Slightly shallow lung volumes.  Cardiac size and mediastinal contours are within normal limits.  Visualized tracheal air column is within normal limits.  The lungs are clear.  No pneumothorax or effusion.  EKG leads and wires overlie the chest. No acute osseous abnormality identified.  IMPRESSION: Negative, no acute cardiopulmonary abnormality.   Original Report Authenticated By: Harley Hallmark, M.D.    US Abdomen  Complete  07/08/2012  *RADIOLOGY REPORT*  Clinical Data: Abdominal pain  ABDOMEN ULTRASOUND  Technique:  Complete abdominal ultrasound examination was performed including evaluation of the liver, gallbladder, bile ducts, pancreas, kidneys, spleen, IVC, and abdominal aorta.  Comparison: CT scan from earlier the same visit  Findings:  Gallbladder:  1.9 cm gallstone without evidence for gallbladder wall thickening or pericholecystic fluid.  The sonographer reports a negative sonographic Murphy's sign.  Common Bile Duct:  Nondilated at 4 mm diameter.  Liver:  Normal.  No focal parenchymal abnormality.  No biliary dilation.  IVC:  Normal.  Pancreas:  Normal.  Spleen:  Normal.  Right kidney:  11.0 cm in long axis.  Normal.  Left kidney:  11.2 cm in long axis.  Normal.  Abdominal Aorta:  No aneurysm.  IMPRESSION: Cholelithiasis without gallbladder wall thickening or pericholecystic fluid.  No biliary dilatation.  Otherwise normal exam.   Original Report Authenticated By: ERIC A. MANSELL, M.D.    US Transvaginal Non-ob  07/08/2012  *RADIOLOGY REPORT*  Clinical Data: Right lower quadrant pain  TRANSABDOMINAL AND TRANSVAGINAL ULTRASOUND OF PELVIS Technique:  Both transabdominal and transvaginal ultrasound examinations of the pelvis were performed. Transabdominal technique was performed for global imaging of the pelvis including uterus, ovaries, adnexal regions, and pelvic cul-de-sac.  It was necessary to proceed with endovaginal exam following the transabdominal exam to visualize the endometrium.  Comparison:  CT scan from earlier the same visit  Findings:  Uterus: 8.6 x 5.0 x 6.2 cm.  Myometrium is homogeneous.  Uterus is retroflexed.  Endometrium: 12 mm in double layer thickness.  No fluid in endometrial canal.  Right ovary:  4.1 x 2.9 x 2.6 cm.  Sonographically normal.  Left ovary: 3.0 x 3.6 x 2.6 cm.  Sonographically normal.  Other findings: Tiny fluid adjacent to the right ovary.  IMPRESSION: Trace amount of fluid in  the right adnexal  space.  This finding can be physiologic in a premenopausal female.  Otherwise normal exam.   Original Report Authenticated By: ERIC A. MANSELL, M.D.    US Pelvis Complete  07/08/2012  *RADIOLOGY REPORT*  Clinical Data: Right lower quadrant pain  TRANSABDOMINAL AND TRANSVAGINAL ULTRASOUND OF PELVIS Technique:  Both transabdominal and transvaginal ultrasound examinations of the pelvis were performed. Transabdominal technique was performed for global imaging of the pelvis including uterus, ovaries, adnexal regions, and pelvic cul-de-sac.  It was necessary to proceed with endovaginal exam following the transabdominal exam to visualize the endometrium.  Comparison:  CT scan from earlier the same visit  Findings:  Uterus: 8.6 x 5.0 x 6.2 cm.  Myometrium is homogeneous.  Uterus is retroflexed.  Endometrium: 12 mm in double layer thickness.  No fluid in endometrial canal.  Right ovary:  4.1 x 2.9 x 2.6 cm.  Sonographically normal.  Left ovary: 3.0 x 3.6 x 2.6 cm.  Sonographically normal.  Other findings: Tiny fluid adjacent to the right ovary.  IMPRESSION: Trace amount of fluid in the right adnexal space.  This finding can be physiologic in a premenopausal female.  Otherwise normal exam.   Original Report Authenticated By: ERIC A. MANSELL, M.D.    Ct Abdomen Pelvis W Contrast  07/07/2012  *RADIOLOGY REPORT*  Clinical Data: Right lower quadrant pain and flank pain.  CT ABDOMEN AND PELVIS WITH CONTRAST  Technique:  Multidetector CT imaging of the abdomen and pelvis was performed following the standard protocol during bolus administration of intravenous contrast.  Contrast: OMNIPAQUE IOHEXOL 300 MG/ML  SOLN  Comparison: CT abdomen pelvis 04/24/2006  Findings: The lung bases are clear.  Heart size is normal.  The liver, gallbladder, spleen, adrenal glands, and pancreas are within normal limits.  Common bile duct is normal in caliber. There is a 4 mm low density lesion in the cortex of the lower  pole of the right kidney.  This is statistically likely a cyst, and appears stable compared to prior study.  The left kidney is normal.  There is no hydronephrosis of either kidney. No visible urinary tract calculus. Both ureters are normal in caliber. There is no perinephric or periureteric stranding.  There is some patient motion which slightly limiting its detail of the images through the mid abdomen.  The bowel loops are normal in caliber and there is no evidence of bowel wall thickening.  A definite/discrete appendix is not identified.  No acute inflammatory changes are seen in the pericecal region.  Urinary bladder is normal.  The uterus is retroverted, and otherwise unremarkable.  Ovaries are within normal limits for a premenopausal patient.  There is no lymphadenopathy, free air, or ascites. Abdominal aorta normal in caliber.  Bones are unremarkable.  IMPRESSION: No acute findings in the abdomen or pelvis.  Negative for mass or lymphadenopathy.   Original Report Authenticated By: Britta Mccreedy, M.D.       Fayrene Helper, PA-C 07/08/12 0111

## 2012-07-07 NOTE — ED Notes (Addendum)
Pt presenting to ed with c/o abdominal pain x 3 days pt denies nausea, vomiting and diarrhea. Pt states she has had fever and sore throat. Pt states her kids have been sick pt's family member interpreting at bedside. Pt states pain is more right side flank pain that radiates into her back pt denies dysuria and hematuria

## 2012-07-07 NOTE — ED Provider Notes (Addendum)
Medical screening examination/treatment/procedure(s) were conducted as a shared visit with non-physician practitioner(s) and myself.  I personally evaluated the patient during the encounter  Loren Racer, MD 07/07/12 2335  Pt with 3 days of URI symptoms similar to several of her family members.  She has also, ongoing episodic R sided abd pain that wraps around to back. She has elevated WBC and LFT's. Though no definite evidence of acute cholecystitis on [place], she does have gallstone. Fayrene Helper, PA will contact surgery when [place] read by radiologist. Dispo per their recommendations.   Loren Racer, MD 07/08/12 0003

## 2012-07-08 LAB — GC/CHLAMYDIA PROBE AMP, GENITAL
Chlamydia, DNA Probe: NEGATIVE
GC Probe Amp, Genital: NEGATIVE

## 2012-07-08 MED ORDER — OXYCODONE-ACETAMINOPHEN 5-325 MG PO TABS: 1.0000 | ORAL_TABLET | Freq: Four times a day (QID) | ORAL | Status: DC | PRN

## 2012-07-08 MED ORDER — ONDANSETRON HCL 4 MG PO TABS: 4.0000 mg | ORAL_TABLET | Freq: Four times a day (QID) | ORAL | Status: DC

## 2012-07-10 NOTE — ED Provider Notes (Signed)
Medical screening examination/treatment/procedure(s) were performed by non-physician practitioner and as supervising physician I was immediately available for consultation/collaboration.   Loren Racer, MD 07/10/12 [phone]

## 2012-07-11 ENCOUNTER — Telehealth (INDEPENDENT_AMBULATORY_CARE_PROVIDER_SITE_OTHER): Payer: Self-pay | Admitting: General Surgery

## 2012-07-11 NOTE — Telephone Encounter (Signed)
Patient husband called in asking if his wife could be seen sooner because her gallbladder is giving her pain again. I asked to speak to her directly. When she was given the phone, she only got to tell me that the gallbladder had given her pain again. When I asked if she had anything to eat the husband took over the conversation. Neither one of them could be clearly understood. When I asked again about what she ate today he said [employer], when I asked to confirm he said she had not eaten all day. I advised she was not to eat anything fried, fatty or oily. Advised the appointment with Dr. Derrell Lolling is the earliest available right now. He said o.k and ended call.

## 2012-07-22 ENCOUNTER — Encounter (INDEPENDENT_AMBULATORY_CARE_PROVIDER_SITE_OTHER): Payer: Self-pay | Admitting: General Surgery

## 2012-07-22 ENCOUNTER — Ambulatory Visit (INDEPENDENT_AMBULATORY_CARE_PROVIDER_SITE_OTHER): Payer: Medicaid Other | Admitting: General Surgery

## 2012-07-22 VITALS — BP 123/84 | HR 87 | Temp 99.0°F | Resp 18 | Ht 65.5 in | Wt 181.0 lb

## 2012-07-22 DIAGNOSIS — K8020 Calculus of gallbladder without cholecystitis without obstruction: Secondary | ICD-10-CM

## 2012-07-22 NOTE — Progress Notes (Signed)
Patient ID: Helen Murphy, female   DOB: 18-Feb-1976, 36 y.o.   MRN: [id number]  Chief Complaint  Patient presents with  . Abdominal Pain    gallstones    HPI Helen Murphy is a 36 y.o. female presents after having gone to be deeper abdominal epigastric right upper quadrant pain. He states that this pain is gone for approximately 3 years on and off. She states the pain in the right upper quadrant is most noticeable after spicy or fatty meals. She does have some nausea at times however no emesis in this last episode. HPI  History reviewed. No pertinent past medical history.  Past Surgical History  Procedure Date  . Cesarean section     History reviewed. No pertinent family history.  Social History History  Substance Use Topics  . Smoking status: Never Smoker   . Smokeless tobacco: Not on file  . Alcohol Use: No    No Known Allergies  Current Outpatient Prescriptions  Medication Sig Dispense Refill  . ondansetron (ZOFRAN) 4 MG tablet Take 1 tablet (4 mg total) by mouth every 6 (six) hours.  12 tablet  0  . oxyCODONE-acetaminophen (PERCOCET/ROXICET) 5-325 MG per tablet Take 1-2 tablets by mouth every 6 (six) hours as needed for pain.  20 tablet  0    Review of Systems Review of Systems  Constitutional: Negative.   HENT: Negative.   Eyes: Negative.   Respiratory: Negative.   Cardiovascular: Negative.   Gastrointestinal: Positive for abdominal pain.    Blood pressure 123/84, pulse 87, temperature 99 F (37.2 C), temperature source Temporal, resp. rate 18, height 5' 5.5" (1.664 m), weight 181 lb (82.101 kg), last menstrual period 06/21/2012.  Physical Exam Physical Exam  Constitutional: She is oriented to person, place, and time. She appears well-developed and well-nourished.  HENT:  Head: Normocephalic and atraumatic.  Eyes: Conjunctivae normal and EOM are normal. Pupils are equal, round, and reactive to light.  Neck: Normal range of motion. Neck  supple.  Cardiovascular: Normal rate, regular rhythm and normal heart sounds.   Pulmonary/Chest: Effort normal and breath sounds normal.  Abdominal: Soft. There is tenderness (RUQ).  Musculoskeletal: Normal range of motion.  Neurological: She is alert and oriented to person, place, and time.    Data Reviewed [place] with 1.7 cm GS , LFTS WNL  Assessment    35 year old female with symptomatic choledocholithiasis    Plan    1. Will proceed to the operative prolapsed out cholecystectomy with intraoperative cholangiogram. 2.  All risks and benefits were discussed with the patient, to generally include infection, bleeding, damage to surrounding structures, and recurrence. Alternatives were offered and described.  All questions were answered and the patient voiced understanding of the procedure and wishes to proceed at this point.        Marigene Ehlers., Dora Clauss 07/22/2012, 1:53 PM

## 2012-10-10 ENCOUNTER — Other Ambulatory Visit (INDEPENDENT_AMBULATORY_CARE_PROVIDER_SITE_OTHER): Payer: Self-pay | Admitting: General Surgery

## 2012-10-10 DIAGNOSIS — K8010 Calculus of gallbladder with chronic cholecystitis without obstruction: Secondary | ICD-10-CM

## 2012-10-10 DIAGNOSIS — K824 Cholesterolosis of gallbladder: Secondary | ICD-10-CM

## 2012-10-26 ENCOUNTER — Ambulatory Visit (INDEPENDENT_AMBULATORY_CARE_PROVIDER_SITE_OTHER): Payer: Medicaid Other | Admitting: General Surgery

## 2012-10-26 ENCOUNTER — Encounter (INDEPENDENT_AMBULATORY_CARE_PROVIDER_SITE_OTHER): Payer: Self-pay | Admitting: General Surgery

## 2012-10-26 VITALS — BP 136/72 | HR 68 | Resp 16 | Ht 65.5 in | Wt 178.8 lb

## 2012-10-26 DIAGNOSIS — Z9889 Other specified postprocedural states: Secondary | ICD-10-CM

## 2012-10-26 DIAGNOSIS — Z9049 Acquired absence of other specified parts of digestive tract: Secondary | ICD-10-CM

## 2012-10-26 NOTE — Progress Notes (Signed)
Patient ID: Helen Murphy, female   DOB: [birth year], 37 y.o.   MRN: [id number] The patient's 37 year old female status post laparoscopic cholecystectomy. Patient has been doing well postoperatively. Patient's appetite is returning. Patient complains of no diarrhea at this time.  On exam: Was clean dry and intact  Assessment and plan: 37 year old female status post laparoscopic cholecystectomy. We discussed weight restrictions for another 4 weeks. Patient followup when necessary.

## 2012-10-28 ENCOUNTER — Telehealth (INDEPENDENT_AMBULATORY_CARE_PROVIDER_SITE_OTHER): Payer: Self-pay | Admitting: General Surgery

## 2012-10-28 NOTE — Telephone Encounter (Signed)
Call for pain med refill.  Per standing order protocol:  Hydrocodone 5/325 mg, # 30,  1-2 po Q4-6H prn pain, no refill called to [facility]:  [phone].

## 2013-09-18 IMAGING — CR DG CHEST 2V
2 series · 2 of 2 positions shown · non-contrast
Comparison: Chest CTA 08/04/2008.

CLINICAL DATA: 36-year-old female left side chest pain cough and
congestion.

CHEST - 2 VIEW

[w chest pa]
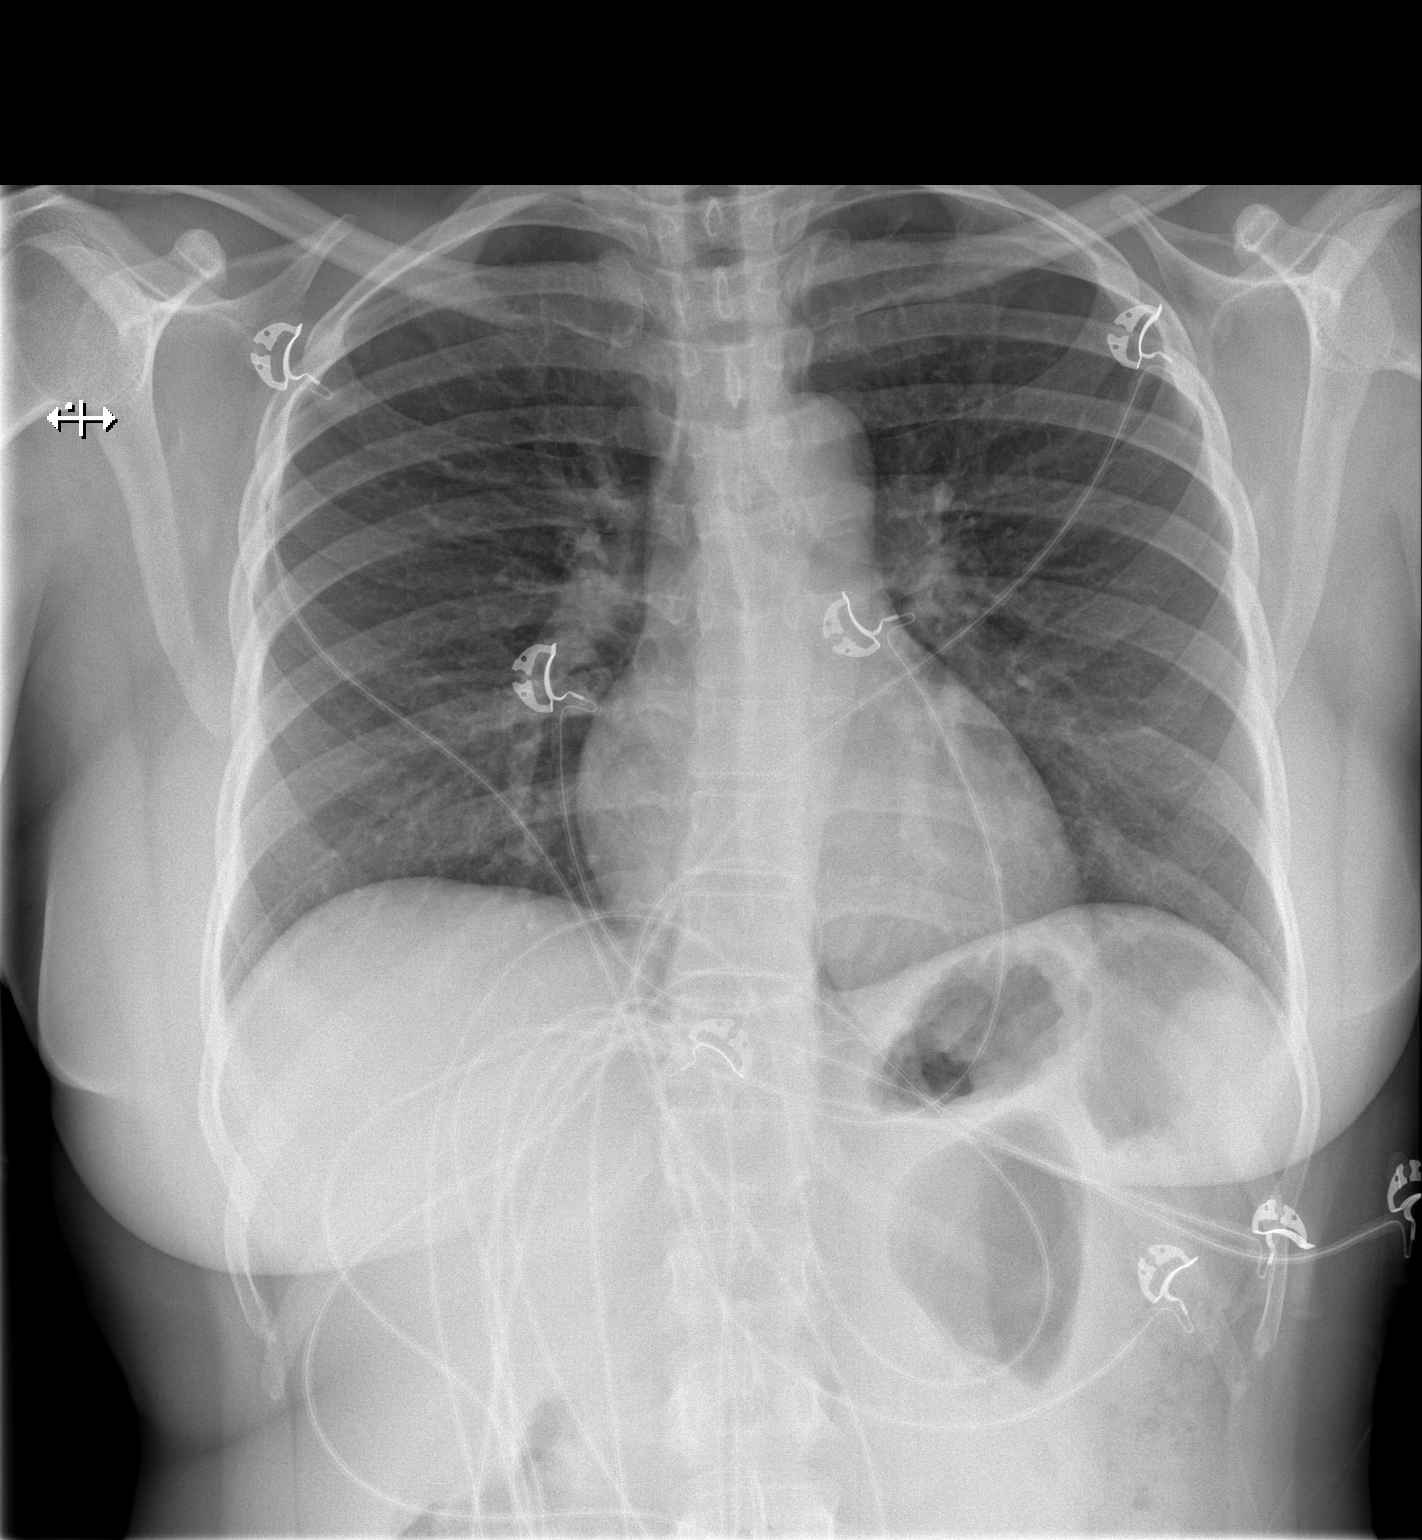

[w chest lat]
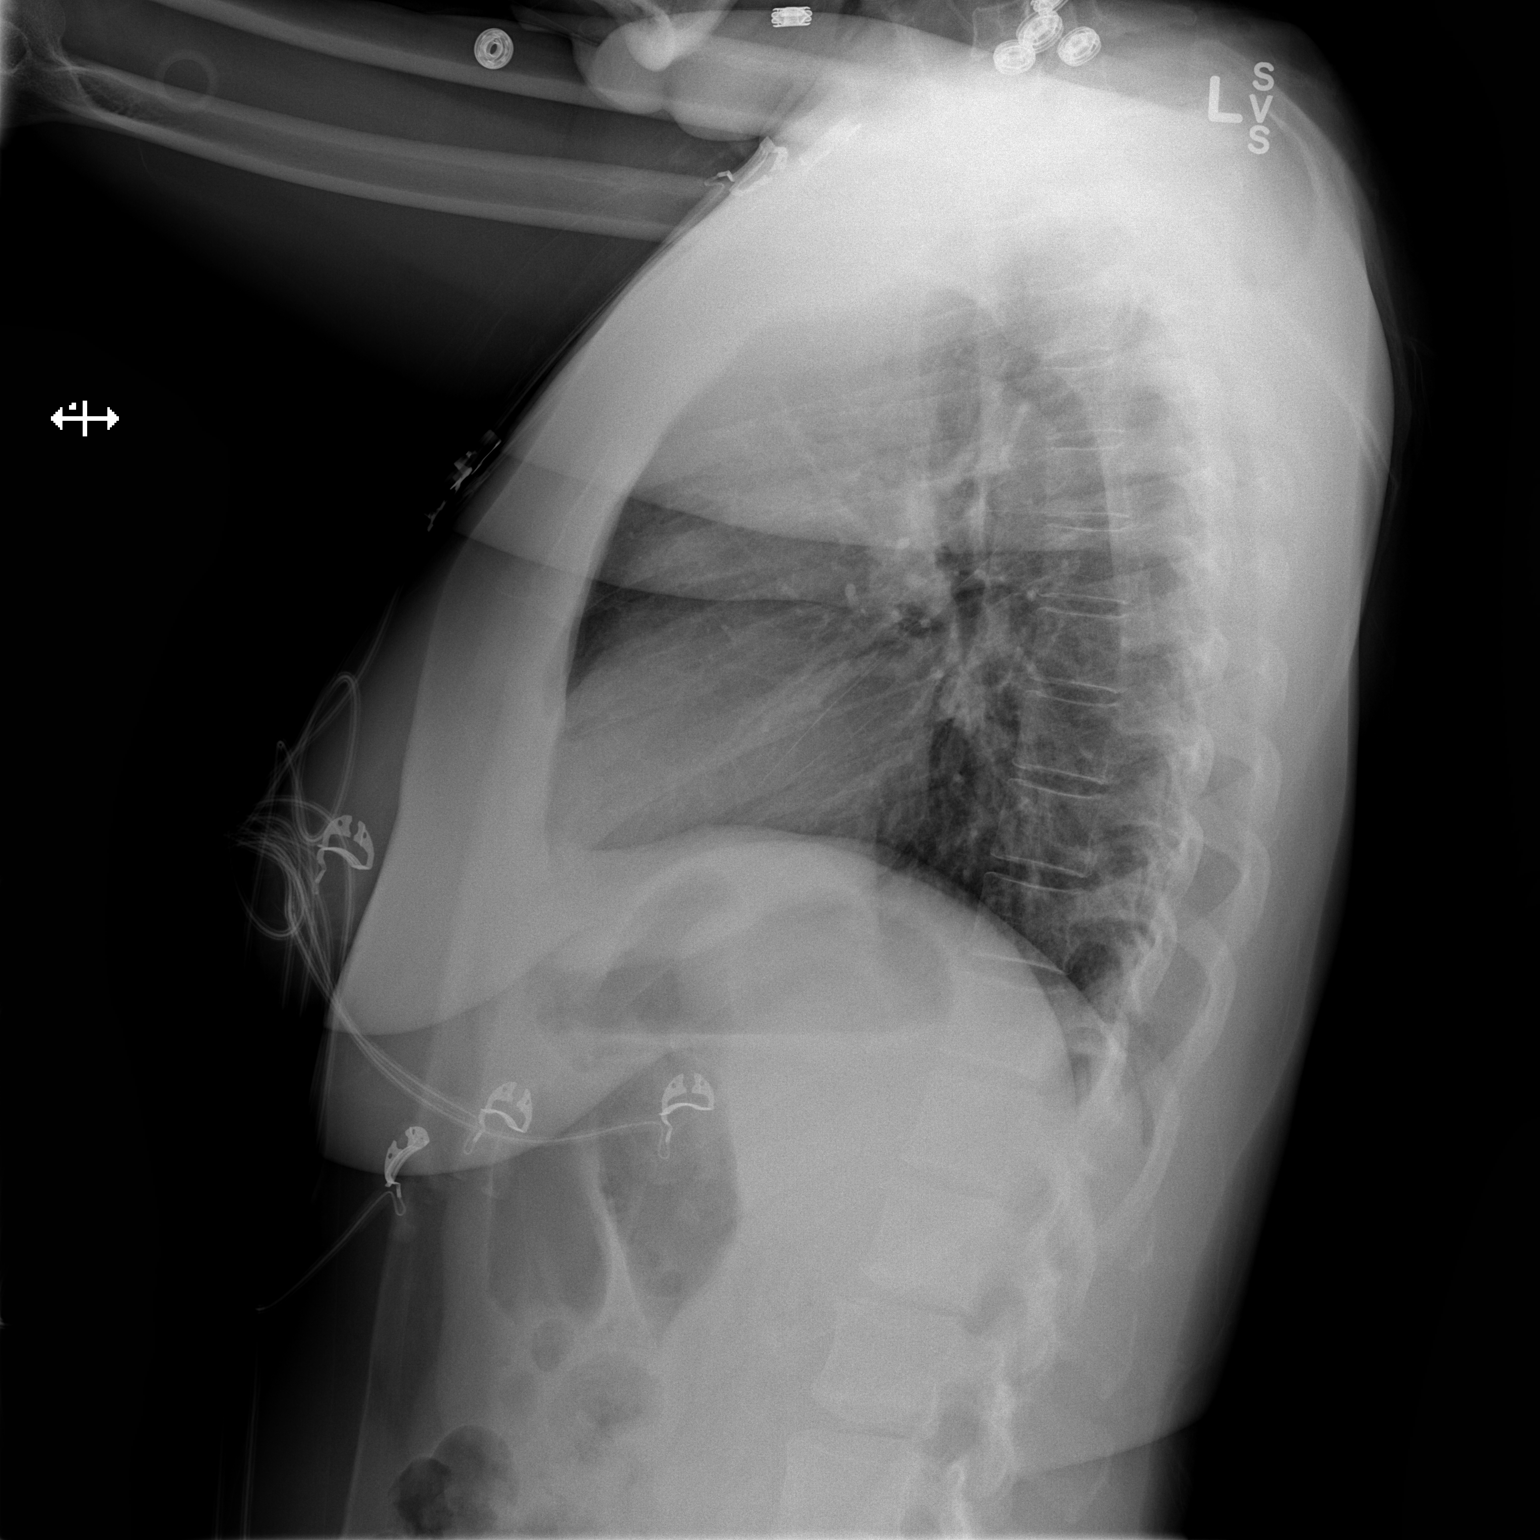

[2 of 2 positions shown; findings below may reference images not displayed]

FINDINGS: Slightly shallow lung volumes.  Cardiac size and
mediastinal contours are within normal limits.  Visualized tracheal
air column is within normal limits.  The lungs are clear.  No
pneumothorax or effusion.  EKG leads and wires overlie the chest.
No acute osseous abnormality identified.
IMPRESSION: Negative, no acute cardiopulmonary abnormality.

## 2015-05-01 ENCOUNTER — Ambulatory Visit (INDEPENDENT_AMBULATORY_CARE_PROVIDER_SITE_OTHER): Payer: Self-pay | Admitting: Family Medicine

## 2015-05-01 VITALS — BP 110/68 | HR 66 | Temp 98.4°F | Resp 18 | Ht 65.75 in | Wt 176.4 lb

## 2015-05-01 DIAGNOSIS — M545 Low back pain, unspecified: Secondary | ICD-10-CM

## 2015-05-01 DIAGNOSIS — R10A0 Flank pain, unspecified side: Secondary | ICD-10-CM

## 2015-05-01 DIAGNOSIS — R945 Abnormal results of liver function studies: Secondary | ICD-10-CM

## 2015-05-01 DIAGNOSIS — R1031 Right lower quadrant pain: Secondary | ICD-10-CM

## 2015-05-01 DIAGNOSIS — R1011 Right upper quadrant pain: Secondary | ICD-10-CM

## 2015-05-01 DIAGNOSIS — N3090 Cystitis, unspecified without hematuria: Secondary | ICD-10-CM

## 2015-05-01 DIAGNOSIS — R109 Unspecified abdominal pain: Secondary | ICD-10-CM

## 2015-05-01 DIAGNOSIS — R7989 Other specified abnormal findings of blood chemistry: Secondary | ICD-10-CM

## 2015-05-01 LAB — POCT UA - MICROSCOPIC ONLY
Casts, Ur, LPF, POC: NEGATIVE
Crystals, Ur, HPF, POC: NEGATIVE
Mucus, UA: NEGATIVE
Yeast, UA: NEGATIVE

## 2015-05-01 LAB — POCT CBC
GRANULOCYTE PERCENT: 40.4 % (ref 37–80)
HEMATOCRIT: 31.8 % — AB (ref 37.7–47.9)
Hemoglobin: 10.4 g/dL — AB (ref 12.2–16.2)
Lymph, poc: 4.1 — AB (ref 0.6–3.4)
MCH, POC: 27.1 pg (ref 27–31.2)
MCHC: 32.7 g/dL (ref 31.8–35.4)
MCV: 83 fL (ref 80–97)
MID (CBC): 0.4 (ref 0–0.9)
MPV: 8.8 fL (ref 0–99.8)
PLATELET COUNT, POC: 219 10*3/uL (ref 142–424)
POC GRANULOCYTE: 3.1 (ref 2–6.9)
POC LYMPH PERCENT: 54.2 %L — AB (ref 10–50)
POC MID %: 5.4 %M (ref 0–12)
RBC: 3.82 M/uL — AB (ref 4.04–5.48)
RDW, POC: 18.5 %
WBC: 7.6 10*3/uL (ref 4.6–10.2)

## 2015-05-01 LAB — POCT URINALYSIS DIPSTICK
Bilirubin, UA: NEGATIVE
Blood, UA: NEGATIVE
Glucose, UA: NEGATIVE
KETONES UA: NEGATIVE
NITRITE UA: NEGATIVE
Protein, UA: NEGATIVE
Spec Grav, UA: 1.02
UROBILINOGEN UA: 0.2
pH, UA: 5.5

## 2015-05-01 LAB — POCT URINE PREGNANCY: Preg Test, Ur: NEGATIVE

## 2015-05-01 MED ORDER — CIPROFLOXACIN HCL 500 MG PO TABS
500.0000 mg | ORAL_TABLET | Freq: Two times a day (BID) | ORAL | Status: DC
Start: 2015-05-01 — End: 2016-07-31

## 2015-05-01 MED ORDER — CYCLOBENZAPRINE HCL 5 MG PO TABS: ORAL_TABLET | ORAL | Status: DC

## 2015-05-01 NOTE — Progress Notes (Addendum)
Subjective:  This chart was scribed for Helen Staggers, MD by Helen Murphy, ED Scribe. This patient was seen in room 10 and the patient's care was started at 4:41 PM.   Patient ID: Helen Murphy, female    DOB: Jul 18, 1976, 39 y.o.   MRN: [id number]  HPI   Chief Complaint  Patient presents with  . Abdominal Pain    Started Sunday, on right side.    HPI Comments: Helen Murphy is a 39 y.o. female who presents to the [facility] complaining of right abdominal pain that began 4 days ago. Pt states pain starts at right abdomen and radiates to right back and right leg. She denies injury.  She has hx of similar pain which usually goes away but current  pain has persisted longer than normal. She denies treating pain. Pt was seen [facility] [place] [facility] at 1:45 today and was thought to have right lower back pain without sciatica. She has hx of gall stones and had cholecystectomy in 2013 but no hx of kidney stones. She has hx of elevated LFT's in November 2013 prior to removal of gallbladder, has not been rechecked.  She denies bladder and bowel incontinence, numbness in groin, fever, hematuria, dysuria. Pt works an Theatre stage manager at [employer] and reports change in work about 2 months.   There are no active problems to display for this patient.  History reviewed. No pertinent past medical history. Past Surgical History  Procedure Laterality Date  . Cesarean section    . Cholecystectomy     No Known Allergies Prior to Admission medications   Not on File   History   Social History  . Marital Status: Married    Spouse Name: N/A  . Number of Children: N/A  . Years of Education: N/A   Occupational History  . Not on file.   Social History Main Topics  . Smoking status: Never Smoker   . Smokeless tobacco: Not on file  . Alcohol Use: No  . Drug Use: No  . Sexual Activity: Not on file   Other Topics Concern  . Not on file   Social History Narrative    Review of Systems  Constitutional: Negative for fever and chills.  Gastrointestinal: Positive for abdominal pain. Negative for nausea and vomiting.  Genitourinary: Negative for dysuria, hematuria, enuresis and difficulty urinating.  Musculoskeletal: Positive for myalgias and back pain.  Skin: Negative for color change and wound.  Neurological: Negative for weakness and numbness.    Objective:   Physical Exam  Constitutional: She is oriented to person, place, and time. She appears well-developed and well-nourished. No distress.  HENT:  Head: Normocephalic and atraumatic.  Eyes: Conjunctivae and EOM are normal.  Neck: Neck supple.  Cardiovascular: Normal rate.   Pulmonary/Chest: Effort normal.  Abdominal: There is tenderness in the right lower quadrant. There is guarding.  Minimal tenderness RUQ including at Mcburney's pont. Negative McMurpheys. Negative heel jar.    Musculoskeletal: Normal range of motion.  Tender along lower LS approximately L4-L5 on right with pain to right lateral low back. No SI joint tenderness. Sciatic notch non tender. Able to heel to toe walk without difficulty. Reproduction of pain at flexion to 90 degrees. Equal lateral flexion but reproduction with left lateral flexion.   Neurological: She is alert and oriented to person, place, and time. She displays no Babinski's sign on the right side. She displays no Babinski's sign on the left side.  Reflex Scores:  Patellar reflexes are 2+ on the right side and 2+ on the left side.      Achilles reflexes are 2+ on the right side and 2+ on the left side. Negative seated straight leg raise.  Skin: Skin is warm and dry.  Psychiatric: She has a normal mood and affect. Her behavior is normal.  Nursing note and vitals reviewed.   Filed Vitals:   05/01/15 1557  BP: 110/68  Pulse: 66  Temp: 98.4 F (36.9 C)  TempSrc: Oral  Resp: 18  Height: 5' 5.75" (1.67 m)  Weight: 176 lb 6.4 oz (80.015 kg)  SpO2: 95%    Results for orders placed or performed in visit on 05/01/15  POCT CBC  Result Value Ref Range   WBC 7.6 4.6 - 10.2 K/uL   Lymph, poc 4.1 (A) 0.6 - 3.4   POC LYMPH PERCENT 54.2 (A) 10 - 50 %L   MID (cbc) 0.4 0 - 0.9   POC MID % 5.4 0 - 12 %M   POC Granulocyte 3.1 2 - 6.9   Granulocyte percent 40.4 37 - 80 %G   RBC 3.82 (A) 4.04 - 5.48 M/uL   Hemoglobin 10.4 (A) 12.2 - 16.2 g/dL   HCT, POC 16.1 (A) 09.6 - 47.9 %   MCV 83.0 80 - 97 fL   MCH, POC 27.1 27 - 31.2 pg   MCHC 32.7 31.8 - 35.4 g/dL   RDW, POC 04.5 %   Platelet Count, POC 219 142 - 424 K/uL   MPV 8.8 0 - 99.8 fL  POCT urinalysis dipstick  Result Value Ref Range   Color, UA yellow    Clarity, UA cloudy    Glucose, UA neg    Bilirubin, UA neg    Ketones, UA neg    Spec Grav, UA 1.020    Blood, UA neg    pH, UA 5.5    Protein, UA neg    Urobilinogen, UA 0.2    Nitrite, UA neg    Leukocytes, UA moderate (2+) (A) Negative  POCT UA - Microscopic Only  Result Value Ref Range   WBC, Ur, HPF, POC 15-20    RBC, urine, microscopic 1-3    Bacteria, U Microscopic 4+    Mucus, UA neg    Epithelial cells, urine per micros tntc    Crystals, Ur, HPF, POC neg    Casts, Ur, LPF, POC neg    Yeast, UA neg   POCT urine pregnancy  Result Value Ref Range   Preg Test, Ur Negative Negative   Assessment & Plan:   Helen Murphy is a 39 y.o. female RUQ abdominal pain, RLQ abdominal pain - Plan: POCT CBC, POCT urinalysis dipstick, POCT UA - Microscopic Only, ciprofloxacin (CIPRO) 500 MG tablet, Urine culture, POCT urine pregnancy.  -afebrile, normal WBC on CBC. Suspected cystitis based on UA in office.   -Start cipro as possible upper tract/early pyelo with associated back pain and radiation.   -discussed anemia, need to repeat this at follow up in few days, or early following week.  rtc precautions if clinical worsening.   Elevated liver function tests Flank pain - Plan: ciprofloxacin (CIPRO) 500 MG tablet, Urine  culture  -repeat LFT's, as elevated prior to cholecystectomy.   Right-sided low back pain without sciatica - Plan: cyclobenzaprine (FLEXERIL) 5 MG tablet, POCT urine pregnancy  -suspected MSK strain/muscle spasm. Trial of flexeril, sx care with ROM and repeat exam if not improving in few days.  Cystitis - Plan: ciprofloxacin (CIPRO) 500 MG tablet, Urine culture  -as above. Start cipro, check urine culture.    Meds ordered this encounter  Medications  . ciprofloxacin (CIPRO) 500 MG tablet    Sig: Take 1 tablet (500 mg total) by mouth 2 (two) times daily.    Dispense:  20 tablet    Refill:  0  . cyclobenzaprine (FLEXERIL) 5 MG tablet    Sig: 1 pill by mouth up to every 8 hours as needed. Start with one pill by mouth each bedtime as needed due to sedation    Dispense:  15 tablet    Refill:  0   Patient Instructions  Start antibiotic for possible urinary infection is moving towards the kidney, but your exam today suggests that the pain in back is more likely from your muscles.  Okay to use Flexeril as a muscle relaxant for back pain, but follow up with me in the next 48 hours if your symptoms have not improved, sooner if worse, and you will need to follow-up if you're unable to return to work on Friday due to your symptoms.  You are also anemic or low blood count here today. I recommend recheck this test either Friday or within the next 1 week. This can be done here or a primary care provider in another office if needed.  You should receive a call or letter about your lab results within the next week to 10 days.   Return to the clinic or go to the nearest emergency room if any of your symptoms worsen or new symptoms occur.    Urinary Tract Infection Urinary tract infections (UTIs) can develop anywhere along your urinary tract. Your urinary tract is your body's drainage system for removing wastes and extra water. Your urinary tract includes two kidneys, two ureters, a bladder, and a  urethra. Your kidneys are a pair of bean-shaped organs. Each kidney is about the size of your fist. They are located below your ribs, one on each side of your spine. CAUSES Infections are caused by microbes, which are microscopic organisms, including fungi, viruses, and bacteria. These organisms are so small that they can only be seen through a microscope. Bacteria are the microbes that most commonly cause UTIs. SYMPTOMS  Symptoms of UTIs may vary by age and gender of the patient and by the location of the infection. Symptoms in young women typically include a frequent and intense urge to urinate and a painful, burning feeling in the bladder or urethra during urination. Older women and men are more likely to be tired, shaky, and weak and have muscle aches and abdominal pain. A fever may mean the infection is in your kidneys. Other symptoms of a kidney infection include pain in your back or sides below the ribs, nausea, and vomiting. DIAGNOSIS To diagnose a UTI, your caregiver will ask you about your symptoms. Your caregiver also will ask to provide a urine sample. The urine sample will be tested for bacteria and white blood cells. White blood cells are made by your body to help fight infection. TREATMENT  Typically, UTIs can be treated with medication. Because most UTIs are caused by a bacterial infection, they usually can be treated with the use of antibiotics. The choice of antibiotic and length of treatment depend on your symptoms and the type of bacteria causing your infection. HOME CARE INSTRUCTIONS  If you were prescribed antibiotics, take them exactly as your caregiver instructs you. Finish the medication even if you  feel better after you have only taken some of the medication.  Drink enough water and fluids to keep your urine clear or pale yellow.  Avoid caffeine, tea, and carbonated beverages. They tend to irritate your bladder.  Empty your bladder often. Avoid holding urine for long  periods of time.  Empty your bladder before and after sexual intercourse.  After a bowel movement, women should cleanse from front to back. Use each tissue only once. SEEK MEDICAL CARE IF:   You have back pain.  You develop a fever.  Your symptoms do not begin to resolve within 3 days. SEEK IMMEDIATE MEDICAL CARE IF:   You have severe back pain or lower abdominal pain.  You develop chills.  You have nausea or vomiting.  You have continued burning or discomfort with urination. MAKE SURE YOU:   Understand these instructions.  Will watch your condition.  Will get help right away if you are not doing well or get worse. Document Released: 07/01/2005 Document Revised: 03/22/2012 Document Reviewed: 10/30/2011 [facility] Patient Information 2015 [place], [place]. This information is not intended to replace advice given to you by your health care provider. Make sure you discuss any questions you have with your health care provider.     I personally performed the services described in this documentation, which was scribed in my presence. The recorded information has been reviewed and considered, and addended by me as needed.

## 2015-05-01 NOTE — Patient Instructions (Addendum)
Start antibiotic for possible urinary infection is moving towards the kidney, but your exam today suggests that the pain in back is more likely from your muscles.  Okay to use Flexeril as a muscle relaxant for back pain, but follow up with me in the next 48 hours if your symptoms have not improved, sooner if worse, and you will need to follow-up if you're unable to return to work on Friday due to your symptoms.  You are also anemic or low blood count here today. I recommend recheck this test either Friday or within the next 1 week. This can be done here or a primary care provider in another office if needed.  You should receive a call or letter about your lab results within the next week to 10 days.   Return to the clinic or go to the nearest emergency room if any of your symptoms worsen or new symptoms occur.    Urinary Tract Infection Urinary tract infections (UTIs) can develop anywhere along your urinary tract. Your urinary tract is your body's drainage system for removing wastes and extra water. Your urinary tract includes two kidneys, two ureters, a bladder, and a urethra. Your kidneys are a pair of bean-shaped organs. Each kidney is about the size of your fist. They are located below your ribs, one on each side of your spine. CAUSES Infections are caused by microbes, which are microscopic organisms, including fungi, viruses, and bacteria. These organisms are so small that they can only be seen through a microscope. Bacteria are the microbes that most commonly cause UTIs. SYMPTOMS  Symptoms of UTIs may vary by age and gender of the patient and by the location of the infection. Symptoms in young women typically include a frequent and intense urge to urinate and a painful, burning feeling in the bladder or urethra during urination. Older women and men are more likely to be tired, shaky, and weak and have muscle aches and abdominal pain. A fever may mean the infection is in your kidneys. Other  symptoms of a kidney infection include pain in your back or sides below the ribs, nausea, and vomiting. DIAGNOSIS To diagnose a UTI, your caregiver will ask you about your symptoms. Your caregiver also will ask to provide a urine sample. The urine sample will be tested for bacteria and white blood cells. White blood cells are made by your body to help fight infection. TREATMENT  Typically, UTIs can be treated with medication. Because most UTIs are caused by a bacterial infection, they usually can be treated with the use of antibiotics. The choice of antibiotic and length of treatment depend on your symptoms and the type of bacteria causing your infection. HOME CARE INSTRUCTIONS  If you were prescribed antibiotics, take them exactly as your caregiver instructs you. Finish the medication even if you feel better after you have only taken some of the medication.  Drink enough water and fluids to keep your urine clear or pale yellow.  Avoid caffeine, tea, and carbonated beverages. They tend to irritate your bladder.  Empty your bladder often. Avoid holding urine for long periods of time.  Empty your bladder before and after sexual intercourse.  After a bowel movement, women should cleanse from front to back. Use each tissue only once. SEEK MEDICAL CARE IF:   You have back pain.  You develop a fever.  Your symptoms do not begin to resolve within 3 days. SEEK IMMEDIATE MEDICAL CARE IF:   You have severe back pain or  lower abdominal pain.  You develop chills.  You have nausea or vomiting.  You have continued burning or discomfort with urination. MAKE SURE YOU:   Understand these instructions.  Will watch your condition.  Will get help right away if you are not doing well or get worse. Document Released: 07/01/2005 Document Revised: 03/22/2012 Document Reviewed: 10/30/2011 [facility] Patient Information 2015 [place], [place]. This information is not intended to replace advice given to  you by your health care provider. Make sure you discuss any questions you have with your health care provider.

## 2015-05-02 LAB — COMPLETE METABOLIC PANEL WITH GFR
ALBUMIN: 4 g/dL (ref 3.6–5.1)
ALT: 14 U/L (ref 6–29)
AST: 25 U/L (ref 10–30)
Alkaline Phosphatase: 58 U/L (ref 33–115)
BILIRUBIN TOTAL: 0.4 mg/dL (ref 0.2–1.2)
BUN: 7 mg/dL (ref 7–25)
CALCIUM: 8.5 mg/dL — AB (ref 8.6–10.2)
CO2: 25 meq/L (ref 20–31)
Chloride: 103 mEq/L (ref 98–110)
Creat: 0.84 mg/dL (ref 0.50–1.10)
GFR, EST NON AFRICAN AMERICAN: 87 mL/min (ref >=60)
GFR, Est African American: >89 mL/min (ref >=60)
Glucose, Bld: 87 mg/dL (ref 65–99)
Potassium: 3.8 mEq/L (ref 3.5–5.3)
Sodium: 137 mEq/L (ref 135–146)
TOTAL PROTEIN: 8 g/dL (ref 6.1–8.1)

## 2015-05-03 LAB — URINE CULTURE

## 2015-05-04 ENCOUNTER — Telehealth: Payer: Self-pay

## 2015-12-07 ENCOUNTER — Ambulatory Visit (INDEPENDENT_AMBULATORY_CARE_PROVIDER_SITE_OTHER): Payer: Self-pay | Admitting: Internal Medicine

## 2015-12-07 VITALS — BP 118/76 | HR 62 | Temp 98.2°F | Resp 18 | Wt 180.6 lb

## 2015-12-07 DIAGNOSIS — M5010 Cervical disc disorder with radiculopathy, unspecified cervical region: Secondary | ICD-10-CM

## 2015-12-07 MED ORDER — MELOXICAM 15 MG PO TABS: 15.0000 mg | ORAL_TABLET | Freq: Every day | ORAL | Status: DC

## 2015-12-07 MED ORDER — CYCLOBENZAPRINE HCL 10 MG PO TABS: 10.0000 mg | ORAL_TABLET | Freq: Every day | ORAL | Status: DC

## 2015-12-07 NOTE — Progress Notes (Addendum)
Subjective:  By signing my name below, I, Stann Ore, attest that this documentation has been prepared under the direction and in the presence of Ellamae Sia, MD. Electronically Signed: Stann Ore, Scribe. 12/07/2015 , 9:03 AM .  Patient was seen in Room 7 .   Patient ID: Helen Murphy, female    DOB: [birth year], 41 y.o.   MRN: [id number] Chief Complaint  Patient presents with  . Arm Pain    right arm from shoulder down to fingertips. x2 months Tingling sensation.    HPI Helen Murphy is a 40 y.o. female who presents to [facility] complaining of right arm pain from her shoulder down to her finger tips that started about 2 months ago. She reports feeling the pain even when she lays down when going to sleep and would wake her up at night. When she uses her right arm in any activity, the pain becomes more noticeable. She denies any swelling in the area.  No prior injury.  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 She informs repetitive motion while at work. She denies heavy lifting at work. Hurts to work this week.  There are no active problems to display for this patient.  Current meds=none  Review of Systems  Constitutional: Negative for fever, chills and fatigue.  Musculoskeletal: Positive for myalgias and arthralgias. Negative for back pain, joint swelling, neck pain and neck stiffness.  Skin: Negative for rash and wound.  Neurological: Negative for dizziness, weakness, light-headedness, numbness and headaches.      Objective:   Physical Exam  Constitutional: She is oriented to person, place, and time. She appears well-developed and well-nourished. No distress.  HENT:  Head: Normocephalic and atraumatic.  Eyes: EOM are normal. Pupils are equal, round, and reactive to light.  Neck: Neck supple. No thyromegaly present.  Neck: full rom without pain, no lymph nodes swelling  Cardiovascular: Normal rate.   Pulmonary/Chest: Effort normal. No respiratory  distress.  Musculoskeletal: Normal range of motion.  Right shoulder: tenderness to palpation over deltoid and bicipital tendon groove with pain on resisted rom but good rom otherwise Elbow: non tender Wrist: intact Right 4th finger: tenderness at the finger tip without swelling, redness or nail deformity, sensation intact, hand has good grip  Lymphadenopathy:    She has no cervical adenopathy.       Right: No supraclavicular adenopathy present.       Left: No supraclavicular adenopathy present.  Neurological: She is alert and oriented to person, place, and time.  Skin: Skin is warm and dry.  Psychiatric: She has a normal mood and affect. Her behavior is normal.  Nursing note and vitals reviewed.   BP 118/76 mmHg  Pulse 62  Temp(Src) 98.2 F (36.8 C) (Oral)  Resp 18  Wt 180 lb 9.6 oz (81.92 kg)  SpO2 99%  LMP 12/04/2015     Assessment & Plan:  Exam consistent with cervical radiculopathy right except for appearance as it sensitivity distal phalanx of the right fourth finger without rash swelling or joint involvement.  Meds ordered this encounter  Medications  . meloxicam (MOBIC) 15 MG tablet    Sig: Take 1 tablet (15 mg total) by mouth daily.    Dispense:  30 tablet    Refill:  0  . cyclobenzaprine (FLEXERIL) 10 MG tablet    Sig: Take 1 tablet (10 mg total) by mouth at bedtime.    Dispense:  30 tablet    Refill:  0   Reexam in  2 weeks see effects of medication and plan for further therapy//imaging next

## 2016-07-31 ENCOUNTER — Emergency Department (HOSPITAL_COMMUNITY): Payer: Self-pay

## 2016-07-31 ENCOUNTER — Encounter (HOSPITAL_COMMUNITY): Payer: Self-pay | Admitting: *Deleted

## 2016-07-31 ENCOUNTER — Emergency Department (HOSPITAL_COMMUNITY)
Admission: EM | Admit: 2016-07-31 | Discharge: 2016-07-31 | Disposition: A | Discharge status: Home / Self Care | Payer: Self-pay | Attending: Emergency Medicine | Admitting: Emergency Medicine

## 2016-07-31 DIAGNOSIS — R51 Headache: Secondary | ICD-10-CM | POA: Insufficient documentation

## 2016-07-31 DIAGNOSIS — R519 Headache, unspecified: Secondary | ICD-10-CM

## 2016-07-31 LAB — COMPREHENSIVE METABOLIC PANEL
ALBUMIN: 3.9 g/dL (ref 3.5–5.0)
ALK PHOS: 54 U/L (ref 38–126)
ALT: 18 U/L (ref 14–54)
ANION GAP: 8 (ref 5–15)
AST: 52 U/L — AB (ref 15–41)
BUN: 6 mg/dL (ref 6–20)
CO2: 26 mmol/L (ref 22–32)
Calcium: 8.8 mg/dL — ABNORMAL LOW (ref 8.9–10.3)
Chloride: 103 mmol/L (ref 101–111)
Creatinine, Ser: 1.07 mg/dL — ABNORMAL HIGH (ref 0.44–1.00)
GFR calc Af Amer: >60 mL/min (ref >60)
GFR calc non Af Amer: >60 mL/min (ref >60)
GLUCOSE: 102 mg/dL — AB (ref 65–99)
POTASSIUM: 3.5 mmol/L (ref 3.5–5.1)
SODIUM: 137 mmol/L (ref 135–145)
Total Bilirubin: 0.7 mg/dL (ref 0.3–1.2)
Total Protein: 8.1 g/dL (ref 6.5–8.1)

## 2016-07-31 LAB — CBC
HEMATOCRIT: 27.8 % — AB (ref 36.0–46.0)
Hemoglobin: 9.4 g/dL — ABNORMAL LOW (ref 12.0–15.0)
MCH: 31.4 pg (ref 26.0–34.0)
MCHC: 33.8 g/dL (ref 30.0–36.0)
MCV: 93 fL (ref 78.0–100.0)
PLATELETS: 139 10*3/uL — AB (ref 150–400)
RBC: 2.99 MIL/uL — AB (ref 3.87–5.11)
RDW: 14 % (ref 11.5–15.5)
WBC: 5.3 10*3/uL (ref 4.0–10.5)

## 2016-07-31 LAB — POC URINE PREG, ED: Preg Test, Ur: NEGATIVE

## 2016-07-31 MED ORDER — IOPAMIDOL (ISOVUE-370) INJECTION 76%
INTRAVENOUS | Status: AC
  Administered 2016-07-31: 50 mL
  Filled 2016-07-31: qty 50

## 2016-07-31 NOTE — ED Triage Notes (Signed)
Pt states headache x 2 months.  Sometimes her tongue feels thick and heavy.  Husband finally convinced her to come to hospital today.

## 2016-07-31 NOTE — ED Notes (Signed)
Papers reviewed with patient and family. Intent to follow up expressed

## 2016-07-31 NOTE — ED Notes (Signed)
Patient to CT.

## 2016-07-31 NOTE — Discharge Instructions (Signed)
You may try taking extra strength Tylenol for your headaches.

## 2016-07-31 NOTE — ED Provider Notes (Signed)
[facility]-EMERGENCY DEPT Provider Note   CSN: [id number] Arrival date & time: 07/31/16  0831     History   Chief Complaint Chief Complaint  Patient presents with  . Headache    HPI Helen Murphy is a 40 year old woman.  HPI She presents with "heaviness" in her head x 2-3 months. Intermittent. Occurs every day. Happens mostly at night. She does not feel that ibuprofen helps. No exacerbating factors. Some sensitive to loud noises. No photophobia. Occasional feeling that her tongue is "heavy and slow". She also has occasional tinnitus. Has never had this happen before.  No fever, weight loss. No confusion, impaired alertness or consciousness. Some paresthesias in both hands but no focal neurologic symptoms or signs, meningismus. No head trauma. Does not wake her from her sleep.  History reviewed. No pertinent past medical history.  Past Surgical History:  Procedure Laterality Date  . CESAREAN SECTION    . CHOLECYSTECTOMY      OB History    No data available       Home Medications    Prior to Admission medications   Medication Sig Start Date End Date Taking? Authorizing Provider  ibuprofen (ADVIL,MOTRIN) 200 MG tablet Take 200 mg by mouth every 6 (six) hours as needed for moderate pain.   Yes Historical Provider, MD    Family History No family history on file.  Social History Social History  Substance Use Topics  . Smoking status: Never Smoker  . Smokeless tobacco: Never Used  . Alcohol use No     Allergies   Review of patient's allergies indicates no known allergies.   Review of Systems Review of Systems Constitutional: no fevers/chills Eyes: no vision changes Ears, nose, mouth, throat, and face: no cough Respiratory: no shortness of breath Cardiovascular: no chest pain Gastrointestinal: no nausea/vomiting, no abdominal pain, no constipation, no diarrhea Genitourinary: no dysuria, no hematuria Integument: no rash Hematologic/lymphatic: no  bleeding/bruising, no edema Musculoskeletal: no arthralgias, no myalgias Neurological: +intermittent bilateral hand paresthesias, no weakness   Physical Exam Updated Vital Signs BP 105/81   Pulse (!) 54   Temp 97.6 F (36.4 C) (Oral)   Resp 11   Ht 5\' 5"  (1.651 m)   Wt 79.4 kg   LMP 07/21/2016   SpO2 100%   BMI 29.12 kg/m   Physical Exam General Apperance: NAD Head: Normocephalic, atraumatic Eyes: PERRL, EOMI, +icteric sclera Ears: Normal external ear canal Nose: Nares normal, septum midline, mucosa normal Throat: Lips, mucosa and tongue normal  Neck: Supple, trachea midline Back: No tenderness or bony abnormality  Lungs: Clear to auscultation bilaterally. No wheezes, rhonchi or rales. Breathing comfortably Chest Wall: Nontender, no deformity Heart: Regular rate and rhythm, no murmur/rub/gallop Abdomen: Soft, nontender, nondistended, no rebound/guarding Extremities: Normal, atraumatic, warm and well perfused, no edema Pulses: 2+ throughout Skin: No rashes or lesions Neurologic: Alert and oriented x 3. CNII-XII intact. Normal strength and sensation   ED Treatments / Results  Labs (all labs ordered are listed, but only abnormal results are displayed) Labs Reviewed  CBC - Abnormal; Notable for the following:       Result Value   RBC 2.99 (*)    Hemoglobin 9.4 (*)    HCT 27.8 (*)    Platelets 139 (*)    All other components within normal limits  COMPREHENSIVE METABOLIC PANEL - Abnormal; Notable for the following:    Glucose, Bld 102 (*)    Creatinine, Ser 1.07 (*)    Calcium 8.8 (*)  AST 52 (*)    All other components within normal limits  POC URINE PREG, ED    EKG  EKG Interpretation None       Radiology Ct Angio Head W Or Wo Contrast  Result Date: 07/31/2016 CLINICAL DATA:  New onset headache.  Bilateral hand paresthesia. EXAM: CT ANGIOGRAPHY HEAD AND NECK TECHNIQUE: Multidetector CT imaging of the head and neck was performed using the standard  protocol during bolus administration of intravenous contrast. Multiplanar CT image reconstructions and MIPs were obtained to evaluate the vascular anatomy. Carotid stenosis measurements (when applicable) are obtained utilizing NASCET criteria, using the distal internal carotid diameter as the denominator. CONTRAST:  50 cc Isovue 370 intravenous COMPARISON:  None. FINDINGS: CT HEAD FINDINGS Brain: No evidence of acute infarction, hemorrhage, hydrocephalus, extra-axial collection or mass lesion/mass effect. Vascular: No hyperdense vessel or unexpected calcification. Skull: Negative Sinuses: Right maxillary and mild right posterior ethmoid sinusitis. Orbits: Negative Review of the MIP images confirms the above findings Bolus tracking was misplaced over the pulmonary arteries and first scan had no systemic arterial contrast. The scan was re-triggered when this was noted by the technologist, and there is suboptimal systemic arterial opacification with venous contamination throughout. In the interest of radiation dose, a third acquisition was not immediately obtained (which would be a fifth head CT in 1 day). CTA NECK FINDINGS Aortic arch: Unremarkable motion degraded appearance. Two vessel branching Right carotid system: Smooth and patent throughout. No atherosclerotic change. Left carotid system: Intermittent motion artifact near the bifurcation. Vessels are smooth and patent throughout. No atherosclerotic change. Vertebral arteries: Obscured in the intervertebral foramen by paravertebral plexus. No evidence of stenosis or dissection. Skeleton: No acute or aggressive finding. Benign-appearing sclerosis beneath the T4 superior endplate. Other neck: Motion artifact at the level of the pharynx and larynx. No evidence of mass or adenopathy. Upper chest: No active disease. Review of the MIP images confirms the above findings CTA HEAD FINDINGS Anterior circulation: Symmetric patent major vessels. No evidence of aneurysm.  Could not reliably detect vessel beading or distal occlusion. Posterior circulation: Standard anatomy with patent major vessels. No evidence of aneurysm. Could not reliably detect vessel beading or distal occlusion. Venous sinuses: Patent Anatomic variants: None noted Delayed phase: No mass. Review of the MIP images confirms the above findings IMPRESSION: 1. No acute intracranial finding. 2. Limited CTA due to contrast timing, as above. No abnormality is detected, including major vessel occlusion or aneurysm. If a brain MRI is needed, could obtain intracranial MRA at that time. 3. Right maxillary sinusitis. Electronically Signed   By: Marnee Spring M.D.   On: 07/31/2016 12:53   Ct Angio Neck W And/or Wo Contrast  Result Date: 07/31/2016 CLINICAL DATA:  New onset headache.  Bilateral hand paresthesia. EXAM: CT ANGIOGRAPHY HEAD AND NECK TECHNIQUE: Multidetector CT imaging of the head and neck was performed using the standard protocol during bolus administration of intravenous contrast. Multiplanar CT image reconstructions and MIPs were obtained to evaluate the vascular anatomy. Carotid stenosis measurements (when applicable) are obtained utilizing NASCET criteria, using the distal internal carotid diameter as the denominator. CONTRAST:  50 cc Isovue 370 intravenous COMPARISON:  None. FINDINGS: CT HEAD FINDINGS Brain: No evidence of acute infarction, hemorrhage, hydrocephalus, extra-axial collection or mass lesion/mass effect. Vascular: No hyperdense vessel or unexpected calcification. Skull: Negative Sinuses: Right maxillary and mild right posterior ethmoid sinusitis. Orbits: Negative Review of the MIP images confirms the above findings Bolus tracking was misplaced over the pulmonary arteries and  first scan had no systemic arterial contrast. The scan was re-triggered when this was noted by the technologist, and there is suboptimal systemic arterial opacification with venous contamination throughout. In the  interest of radiation dose, a third acquisition was not immediately obtained (which would be a fifth head CT in 1 day). CTA NECK FINDINGS Aortic arch: Unremarkable motion degraded appearance. Two vessel branching Right carotid system: Smooth and patent throughout. No atherosclerotic change. Left carotid system: Intermittent motion artifact near the bifurcation. Vessels are smooth and patent throughout. No atherosclerotic change. Vertebral arteries: Obscured in the intervertebral foramen by paravertebral plexus. No evidence of stenosis or dissection. Skeleton: No acute or aggressive finding. Benign-appearing sclerosis beneath the T4 superior endplate. Other neck: Motion artifact at the level of the pharynx and larynx. No evidence of mass or adenopathy. Upper chest: No active disease. Review of the MIP images confirms the above findings CTA HEAD FINDINGS Anterior circulation: Symmetric patent major vessels. No evidence of aneurysm. Could not reliably detect vessel beading or distal occlusion. Posterior circulation: Standard anatomy with patent major vessels. No evidence of aneurysm. Could not reliably detect vessel beading or distal occlusion. Venous sinuses: Patent Anatomic variants: None noted Delayed phase: No mass. Review of the MIP images confirms the above findings IMPRESSION: 1. No acute intracranial finding. 2. Limited CTA due to contrast timing, as above. No abnormality is detected, including major vessel occlusion or aneurysm. If a brain MRI is needed, could obtain intracranial MRA at that time. 3. Right maxillary sinusitis. Electronically Signed   By: Marnee SpringJonathon  Watts M.D.   On: 07/31/2016 12:53   Procedures   Medications Ordered in ED Medications  iopamidol (ISOVUE-370) 76 % injection (50 mLs  Contrast Given 07/31/16 1211)     Initial Impression / Assessment and Plan / ED Course  I have reviewed the triage vital signs and the nursing notes.  Pertinent labs & imaging results that were available  during my care of the patient were reviewed by me and considered in my medical decision making (see chart for details).  Clinical Course  40 year old woman presents with intermittent "heaviness" in her head x 2-3 months. Cr 1.07 and her baseline is around 0.8. CMP otherwise unremarkable. No leukocytosis on CBC. Neg urine preg. CTA head/neck with no acute intracranial findings.   Final Clinical Impressions(s) / ED Diagnoses   Final diagnoses:  Nonintractable episodic headache, unspecified headache type  No acute intracranial findings on CTA head/neck. Can take Tylenol prn. Follow up with neurology.  New Prescriptions Current Discharge Medication List       Lora PaulaJennifer T Krall, MD 07/31/16 1325    Marily MemosJason Mesner, MD 07/31/16 [id number]    Marily MemosJason Mesner, MD 07/31/16 [phone]

## 2016-12-24 ENCOUNTER — Ambulatory Visit: Payer: Medicaid Other | Admitting: Family Medicine

## 2017-10-12 IMAGING — CT CT ANGIO HEAD
1 of 20 series · 1 of 33 positions shown · IV contrast (Iohexol (Omnipaque 350))
Comparison: None.

CLINICAL DATA: New onset headache.  Bilateral hand paresthesia.

EXAM:
CT ANGIOGRAPHY HEAD AND NECK
TECHNIQUE: Multidetector CT imaging of the head and neck was performed using
the standard protocol during bolus administration of intravenous
contrast. Multiplanar CT image reconstructions and MIPs were
obtained to evaluate the vascular anatomy. Carotid stenosis
measurements (when applicable) are obtained utilizing NASCET
criteria, using the distal internal carotid diameter as the
denominator.
CONTRAST:  50 cc Isovue 370 intravenous

[Series 300: locator · axial · 0.49mm/px · 1 of 1 slices shown]
[im 1/1  soft-tissue]
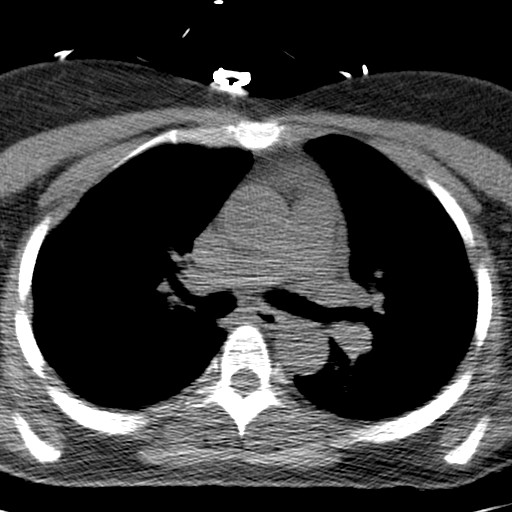

[1 of 33 positions shown; findings below may reference images not displayed]

FINDINGS: CT HEAD FINDINGS

Brain: No evidence of acute infarction, hemorrhage, hydrocephalus,
extra-axial collection or mass lesion/mass effect.

Vascular: No hyperdense vessel or unexpected calcification.

Skull: Negative

Sinuses: Right maxillary and mild right posterior ethmoid sinusitis.

Orbits: Negative

Review of the MIP images confirms the above findings

Bolus tracking was misplaced over the pulmonary arteries and first
scan had no systemic arterial contrast. The scan was re-triggered
when this was noted by the technologist, and there is suboptimal
systemic arterial opacification with venous contamination
throughout. In the interest of radiation dose, a third acquisition
was not immediately obtained (which would be a fifth head CT in 1
day).

CTA NECK FINDINGS

Aortic arch: Unremarkable motion degraded appearance. Two vessel
branching

Right carotid system: Smooth and patent throughout. No
atherosclerotic change.

Left carotid system: Intermittent motion artifact near the
bifurcation. Vessels are smooth and patent throughout. No
atherosclerotic change.

Vertebral arteries: Obscured in the intervertebral foramen by
paravertebral plexus. No evidence of stenosis or dissection.

Skeleton: No acute or aggressive finding. Benign-appearing sclerosis
beneath the T4 superior endplate.

Other neck: Motion artifact at the level of the pharynx and larynx.
No evidence of mass or adenopathy.

Upper chest: No active disease.

Review of the MIP images confirms the above findings

CTA HEAD FINDINGS

Anterior circulation: Symmetric patent major vessels. No evidence of
aneurysm. Could not reliably detect vessel beading or distal
occlusion.

Posterior circulation: Standard anatomy with patent major vessels.
No evidence of aneurysm. Could not reliably detect vessel beading or
distal occlusion.

Venous sinuses: Patent

Anatomic variants: None noted

Delayed phase: No mass.

Review of the MIP images confirms the above findings
IMPRESSION: 1. No acute intracranial finding.
2. Limited CTA due to contrast timing, as above. No abnormality is
detected, including major vessel occlusion or aneurysm. If a brain
MRI is needed, could obtain intracranial MRA at that time.
3. Right maxillary sinusitis.

## 2022-10-12 ENCOUNTER — Other Ambulatory Visit (HOSPITAL_COMMUNITY): Payer: Self-pay
